# Patient Record
Sex: Female | Born: 1935 | Race: White | Hispanic: No | Smoking: Current every day smoker
Health system: Southern US, Community
[De-identification: ages and names within clinical notes are randomized; demographics above are authoritative.]

## PROBLEM LIST (undated history)

## (undated) DIAGNOSIS — Z9889 Other specified postprocedural states: Secondary | ICD-10-CM

## (undated) DIAGNOSIS — S2239XA Fracture of one rib, unspecified side, initial encounter for closed fracture: Secondary | ICD-10-CM

## (undated) DIAGNOSIS — S2249XA Multiple fractures of ribs, unspecified side, initial encounter for closed fracture: Secondary | ICD-10-CM

## (undated) DIAGNOSIS — I4891 Unspecified atrial fibrillation: Secondary | ICD-10-CM

## (undated) DIAGNOSIS — C801 Malignant (primary) neoplasm, unspecified: Secondary | ICD-10-CM

## (undated) DIAGNOSIS — E119 Type 2 diabetes mellitus without complications: Secondary | ICD-10-CM

## (undated) DIAGNOSIS — C50919 Malignant neoplasm of unspecified site of unspecified female breast: Secondary | ICD-10-CM

## (undated) DIAGNOSIS — K222 Esophageal obstruction: Secondary | ICD-10-CM

## (undated) DIAGNOSIS — E785 Hyperlipidemia, unspecified: Secondary | ICD-10-CM

## (undated) DIAGNOSIS — K219 Gastro-esophageal reflux disease without esophagitis: Secondary | ICD-10-CM

## (undated) DIAGNOSIS — K589 Irritable bowel syndrome without diarrhea: Secondary | ICD-10-CM

## (undated) DIAGNOSIS — H3530 Unspecified macular degeneration: Secondary | ICD-10-CM

## (undated) DIAGNOSIS — S3210XA Unspecified fracture of sacrum, initial encounter for closed fracture: Secondary | ICD-10-CM

## (undated) DIAGNOSIS — E049 Nontoxic goiter, unspecified: Secondary | ICD-10-CM

## (undated) DIAGNOSIS — D0510 Intraductal carcinoma in situ of unspecified breast: Secondary | ICD-10-CM

## (undated) DIAGNOSIS — K449 Diaphragmatic hernia without obstruction or gangrene: Secondary | ICD-10-CM

## (undated) DIAGNOSIS — K859 Acute pancreatitis without necrosis or infection, unspecified: Principal | ICD-10-CM

## (undated) DIAGNOSIS — C50912 Malignant neoplasm of unspecified site of left female breast: Secondary | ICD-10-CM

## (undated) DIAGNOSIS — S329XXA Fracture of unspecified parts of lumbosacral spine and pelvis, initial encounter for closed fracture: Secondary | ICD-10-CM

## (undated) DIAGNOSIS — J449 Chronic obstructive pulmonary disease, unspecified: Secondary | ICD-10-CM

## (undated) HISTORY — DX: Other specified postprocedural states: Z98.890

## (undated) HISTORY — DX: Acute pancreatitis without necrosis or infection, unspecified: K85.90

## (undated) HISTORY — DX: Nontoxic goiter, unspecified: E04.9

## (undated) HISTORY — DX: Irritable bowel syndrome, unspecified: K58.9

## (undated) HISTORY — DX: Unspecified atrial fibrillation: I48.91

## (undated) HISTORY — DX: Unspecified macular degeneration: H35.30

## (undated) HISTORY — DX: Malignant neoplasm of unspecified site of left female breast: C50.912

## (undated) HISTORY — PX: APPENDECTOMY: SHX54

## (undated) HISTORY — PX: OTHER SURGICAL HISTORY: SHX169

## (undated) HISTORY — DX: Malignant neoplasm of unspecified site of unspecified female breast: C50.919

## (undated) HISTORY — PX: TUBAL LIGATION: SHX77

## (undated) HISTORY — DX: Gastro-esophageal reflux disease without esophagitis: K21.9

## (undated) HISTORY — DX: Hyperlipidemia, unspecified: E78.5

## (undated) HISTORY — DX: Type 2 diabetes mellitus without complications: E11.9

## (undated) HISTORY — DX: Chronic obstructive pulmonary disease, unspecified: J44.9

## (undated) HISTORY — DX: Diaphragmatic hernia without obstruction or gangrene: K44.9

## (undated) HISTORY — PX: BREAST LUMPECTOMY: SHX2

## (undated) HISTORY — DX: Esophageal obstruction: K22.2

## (undated) HISTORY — DX: Intraductal carcinoma in situ of unspecified breast: D05.10

## (undated) HISTORY — PX: CHOLECYSTECTOMY: SHX55

---

## 2000-09-13 ENCOUNTER — Encounter: Payer: Self-pay | Admitting: Internal Medicine

## 2000-09-13 ENCOUNTER — Ambulatory Visit (HOSPITAL_COMMUNITY): Admission: RE | Admit: 2000-09-13 | Discharge: 2000-09-13 | Payer: Self-pay | Admitting: Internal Medicine

## 2001-07-02 ENCOUNTER — Ambulatory Visit (HOSPITAL_COMMUNITY): Admission: RE | Admit: 2001-07-02 | Discharge: 2001-07-02 | Payer: Self-pay | Admitting: Family Medicine

## 2001-07-02 ENCOUNTER — Encounter: Payer: Self-pay | Admitting: Family Medicine

## 2001-07-24 ENCOUNTER — Encounter: Payer: Self-pay | Admitting: Family Medicine

## 2001-07-24 ENCOUNTER — Ambulatory Visit (HOSPITAL_COMMUNITY): Admission: RE | Admit: 2001-07-24 | Discharge: 2001-07-24 | Payer: Self-pay | Admitting: Family Medicine

## 2001-07-30 ENCOUNTER — Ambulatory Visit (HOSPITAL_COMMUNITY): Admission: RE | Admit: 2001-07-30 | Discharge: 2001-07-30 | Payer: Self-pay | Admitting: Ophthalmology

## 2001-10-18 DIAGNOSIS — Z9889 Other specified postprocedural states: Secondary | ICD-10-CM

## 2001-10-18 HISTORY — DX: Other specified postprocedural states: Z98.890

## 2001-11-04 ENCOUNTER — Ambulatory Visit (HOSPITAL_COMMUNITY): Admission: RE | Admit: 2001-11-04 | Discharge: 2001-11-04 | Payer: Self-pay | Admitting: Internal Medicine

## 2001-11-04 HISTORY — PX: COLONOSCOPY: SHX174

## 2001-11-04 HISTORY — PX: ESOPHAGOGASTRODUODENOSCOPY: SHX1529

## 2001-12-23 ENCOUNTER — Ambulatory Visit (HOSPITAL_COMMUNITY): Admission: RE | Admit: 2001-12-23 | Discharge: 2001-12-23 | Payer: Self-pay | Admitting: Internal Medicine

## 2001-12-23 ENCOUNTER — Encounter: Payer: Self-pay | Admitting: Internal Medicine

## 2002-01-15 ENCOUNTER — Encounter (INDEPENDENT_AMBULATORY_CARE_PROVIDER_SITE_OTHER): Payer: Self-pay | Admitting: Internal Medicine

## 2002-01-15 ENCOUNTER — Ambulatory Visit (HOSPITAL_COMMUNITY): Admission: RE | Admit: 2002-01-15 | Discharge: 2002-01-15 | Payer: Self-pay | Admitting: Internal Medicine

## 2002-05-14 ENCOUNTER — Other Ambulatory Visit: Admission: RE | Admit: 2002-05-14 | Discharge: 2002-05-14 | Payer: Self-pay | Admitting: Dermatology

## 2002-06-19 ENCOUNTER — Encounter: Payer: Self-pay | Admitting: Family Medicine

## 2002-06-19 ENCOUNTER — Ambulatory Visit (HOSPITAL_COMMUNITY): Admission: RE | Admit: 2002-06-19 | Discharge: 2002-06-19 | Payer: Self-pay | Admitting: Family Medicine

## 2002-08-12 ENCOUNTER — Ambulatory Visit (HOSPITAL_COMMUNITY): Admission: RE | Admit: 2002-08-12 | Discharge: 2002-08-12 | Payer: Self-pay | Admitting: Family Medicine

## 2002-08-12 ENCOUNTER — Encounter: Payer: Self-pay | Admitting: Family Medicine

## 2002-09-08 ENCOUNTER — Encounter: Payer: Self-pay | Admitting: Family Medicine

## 2002-09-08 ENCOUNTER — Ambulatory Visit (HOSPITAL_COMMUNITY): Admission: RE | Admit: 2002-09-08 | Discharge: 2002-09-08 | Payer: Self-pay | Admitting: Family Medicine

## 2002-09-16 ENCOUNTER — Encounter (HOSPITAL_COMMUNITY): Admission: RE | Admit: 2002-09-16 | Discharge: 2002-10-16 | Payer: Self-pay | Admitting: Family Medicine

## 2002-09-17 ENCOUNTER — Encounter: Payer: Self-pay | Admitting: Family Medicine

## 2002-12-24 ENCOUNTER — Ambulatory Visit (HOSPITAL_COMMUNITY): Admission: RE | Admit: 2002-12-24 | Discharge: 2002-12-24 | Payer: Self-pay | Admitting: Family Medicine

## 2002-12-24 ENCOUNTER — Encounter: Payer: Self-pay | Admitting: Family Medicine

## 2003-08-02 ENCOUNTER — Inpatient Hospital Stay (HOSPITAL_COMMUNITY): Admission: EM | Admit: 2003-08-02 | Discharge: 2003-08-03 | Payer: Self-pay | Admitting: Emergency Medicine

## 2003-11-24 ENCOUNTER — Encounter (HOSPITAL_COMMUNITY): Admission: RE | Admit: 2003-11-24 | Discharge: 2003-11-25 | Payer: Self-pay | Admitting: Internal Medicine

## 2004-01-08 ENCOUNTER — Inpatient Hospital Stay (HOSPITAL_COMMUNITY): Admission: EM | Admit: 2004-01-08 | Discharge: 2004-01-11 | Payer: Self-pay | Admitting: Emergency Medicine

## 2004-01-11 HISTORY — PX: ESOPHAGOGASTRODUODENOSCOPY: SHX1529

## 2004-01-11 HISTORY — PX: COLONOSCOPY: SHX174

## 2004-01-18 ENCOUNTER — Ambulatory Visit (HOSPITAL_COMMUNITY): Admission: RE | Admit: 2004-01-18 | Discharge: 2004-01-18 | Payer: Self-pay | Admitting: Internal Medicine

## 2004-02-09 ENCOUNTER — Ambulatory Visit (HOSPITAL_COMMUNITY): Admission: RE | Admit: 2004-02-09 | Discharge: 2004-02-09 | Payer: Self-pay | Admitting: Internal Medicine

## 2004-02-25 ENCOUNTER — Ambulatory Visit: Payer: Self-pay | Admitting: Internal Medicine

## 2004-12-14 ENCOUNTER — Ambulatory Visit (HOSPITAL_COMMUNITY): Admission: RE | Admit: 2004-12-14 | Discharge: 2004-12-14 | Payer: Self-pay | Admitting: Family Medicine

## 2005-01-18 ENCOUNTER — Emergency Department (HOSPITAL_COMMUNITY): Admission: EM | Admit: 2005-01-18 | Discharge: 2005-01-18 | Payer: Self-pay | Admitting: Emergency Medicine

## 2005-02-22 ENCOUNTER — Ambulatory Visit: Payer: Self-pay | Admitting: Internal Medicine

## 2005-05-18 ENCOUNTER — Ambulatory Visit (HOSPITAL_COMMUNITY): Admission: RE | Admit: 2005-05-18 | Discharge: 2005-05-18 | Payer: Self-pay | Admitting: Family Medicine

## 2005-05-30 ENCOUNTER — Ambulatory Visit (HOSPITAL_COMMUNITY): Admission: RE | Admit: 2005-05-30 | Discharge: 2005-05-30 | Payer: Self-pay | Admitting: Family Medicine

## 2005-06-14 ENCOUNTER — Encounter (INDEPENDENT_AMBULATORY_CARE_PROVIDER_SITE_OTHER): Payer: Self-pay | Admitting: Diagnostic Radiology

## 2005-06-14 ENCOUNTER — Encounter (INDEPENDENT_AMBULATORY_CARE_PROVIDER_SITE_OTHER): Payer: Self-pay | Admitting: Specialist

## 2005-06-14 ENCOUNTER — Ambulatory Visit (HOSPITAL_COMMUNITY): Admission: RE | Admit: 2005-06-14 | Discharge: 2005-06-14 | Payer: Self-pay | Admitting: Family Medicine

## 2005-06-21 ENCOUNTER — Ambulatory Visit (HOSPITAL_COMMUNITY): Admission: RE | Admit: 2005-06-21 | Discharge: 2005-06-21 | Payer: Self-pay | Admitting: *Deleted

## 2005-07-26 ENCOUNTER — Encounter (INDEPENDENT_AMBULATORY_CARE_PROVIDER_SITE_OTHER): Payer: Self-pay | Admitting: Specialist

## 2005-07-26 ENCOUNTER — Ambulatory Visit (HOSPITAL_COMMUNITY): Admission: RE | Admit: 2005-07-26 | Discharge: 2005-07-26 | Payer: Self-pay | Admitting: General Surgery

## 2006-07-11 ENCOUNTER — Ambulatory Visit (HOSPITAL_COMMUNITY): Admission: RE | Admit: 2006-07-11 | Discharge: 2006-07-11 | Payer: Self-pay | Admitting: General Surgery

## 2006-12-14 ENCOUNTER — Ambulatory Visit (HOSPITAL_COMMUNITY): Admission: RE | Admit: 2006-12-14 | Discharge: 2006-12-14 | Payer: Self-pay | Admitting: Family Medicine

## 2007-05-17 ENCOUNTER — Emergency Department (HOSPITAL_COMMUNITY): Admission: EM | Admit: 2007-05-17 | Discharge: 2007-05-17 | Payer: Self-pay | Admitting: Emergency Medicine

## 2007-07-15 ENCOUNTER — Ambulatory Visit (HOSPITAL_COMMUNITY): Admission: RE | Admit: 2007-07-15 | Discharge: 2007-07-15 | Payer: Self-pay | Admitting: Family Medicine

## 2007-07-24 ENCOUNTER — Encounter: Admission: RE | Admit: 2007-07-24 | Discharge: 2007-07-24 | Payer: Self-pay | Admitting: Family Medicine

## 2007-07-24 ENCOUNTER — Encounter (INDEPENDENT_AMBULATORY_CARE_PROVIDER_SITE_OTHER): Payer: Self-pay | Admitting: Diagnostic Radiology

## 2007-08-06 ENCOUNTER — Encounter: Admission: RE | Admit: 2007-08-06 | Discharge: 2007-08-06 | Payer: Self-pay | Admitting: Family Medicine

## 2007-12-31 ENCOUNTER — Ambulatory Visit (HOSPITAL_COMMUNITY): Admission: RE | Admit: 2007-12-31 | Discharge: 2007-12-31 | Payer: Self-pay | Admitting: Family Medicine

## 2008-01-27 ENCOUNTER — Ambulatory Visit (HOSPITAL_COMMUNITY): Payer: Self-pay | Admitting: Oncology

## 2008-06-09 ENCOUNTER — Ambulatory Visit (HOSPITAL_COMMUNITY): Admission: RE | Admit: 2008-06-09 | Discharge: 2008-06-09 | Payer: Self-pay | Admitting: Ophthalmology

## 2008-07-27 ENCOUNTER — Ambulatory Visit (HOSPITAL_COMMUNITY): Payer: Self-pay | Admitting: Oncology

## 2008-08-03 ENCOUNTER — Ambulatory Visit (HOSPITAL_COMMUNITY): Admission: RE | Admit: 2008-08-03 | Discharge: 2008-08-03 | Payer: Self-pay | Admitting: Family Medicine

## 2009-04-07 ENCOUNTER — Ambulatory Visit (HOSPITAL_COMMUNITY): Admission: RE | Admit: 2009-04-07 | Discharge: 2009-04-07 | Payer: Self-pay | Admitting: Family Medicine

## 2009-04-21 ENCOUNTER — Ambulatory Visit (HOSPITAL_COMMUNITY): Payer: Self-pay | Admitting: Oncology

## 2009-05-30 ENCOUNTER — Emergency Department (HOSPITAL_COMMUNITY): Admission: EM | Admit: 2009-05-30 | Discharge: 2009-05-30 | Payer: Self-pay | Admitting: Emergency Medicine

## 2009-08-31 ENCOUNTER — Ambulatory Visit (HOSPITAL_COMMUNITY): Admission: RE | Admit: 2009-08-31 | Discharge: 2009-08-31 | Payer: Self-pay | Admitting: Family Medicine

## 2009-10-20 ENCOUNTER — Ambulatory Visit (HOSPITAL_COMMUNITY): Payer: Self-pay | Admitting: Oncology

## 2010-01-04 ENCOUNTER — Ambulatory Visit (HOSPITAL_COMMUNITY): Admission: RE | Admit: 2010-01-04 | Discharge: 2010-01-04 | Payer: Self-pay | Admitting: Urology

## 2010-04-10 ENCOUNTER — Encounter: Payer: Self-pay | Admitting: Family Medicine

## 2010-06-02 ENCOUNTER — Encounter: Payer: Self-pay | Admitting: Gastroenterology

## 2010-06-02 ENCOUNTER — Ambulatory Visit (INDEPENDENT_AMBULATORY_CARE_PROVIDER_SITE_OTHER): Payer: Self-pay | Admitting: Gastroenterology

## 2010-06-02 DIAGNOSIS — K589 Irritable bowel syndrome without diarrhea: Secondary | ICD-10-CM

## 2010-06-02 DIAGNOSIS — K219 Gastro-esophageal reflux disease without esophagitis: Secondary | ICD-10-CM

## 2010-06-06 ENCOUNTER — Encounter: Payer: Self-pay | Admitting: Gastroenterology

## 2010-06-07 NOTE — Assessment & Plan Note (Signed)
Summary: abdominal pain-   Vital Signs:  Patient profile:   77 year old female Height:      62 inches Weight:      135 pounds BMI:     24.78 Temp:     97.8 degrees F oral Pulse rate:   68 / minute BP sitting:   136 / 76  (left arm)  Vitals Entered By: Carolan Clines LPN (June 02, 2010 8:42 AM)  Visit Type:  Follow-up Visit   History of Present Illness: [name] is a pleasant 77 year old Caucasian female who actually appears younger than stated age. She has hx of chronic lower abdominal pain, IBS-d, wt loss, and GERD. Last seen in Dec 2006 with a wt of 135. Wt remains stable today. Last EGD/colon in 2005 (incomplete colonoscopy) with reflux esophagitis, Schatzki's ring s/p dilation, normal rectum and densely populated sigmoid diverticula. Presents today with lower abdominal crampy discomfort, right before BM, relieved afterwards. Will alternate between 5-6 soft stools/day and then have periods of 1-2 stools per day. Usually postprandial, no melena or brbpr. Denies any nausea. Has been on dicyclomine in past, yet has had to taper off to just once/day due to finances. Feels like it didn't really help. States immodium helps significantly with frequency of stools and abdominal cramping. Not using supplemental fiber. Does have occasional reflux; has taken nexium in past but became too expensive. Did well with Prilosec. No dysphagia or odynophagia.   Current Medications (verified): 1)  Eye-Vite Extra Plus Lutein  Tabs (Multiple Vitamins-Minerals) .... Take One Bid 2)  Glipizide 2.5 Mg Xr24h-Tab (Glipizide) .... Take One Once Daily 3)  Simvastatin 5 Mg Tabs (Simvastatin) .... Take One Once Daily At Bed Time 4)  Dicyclomine Hcl 10 Mg Caps (Dicyclomine Hcl) .... Take Two in The Morning and One in The Evening 5)  Alprazolam 0.5 Mg Tabs (Alprazolam) .... Take One Qid As Needed 6)  Gemfibrozil 600 Mg Tabs (Gemfibrozil) .... Take One Two Times A Day 7)  Warfarin Sodium 5 Mg Tabs (Warfarin Sodium)  .... Take As Directed By Physician 8)  Lisinopril 40 Mg Tabs (Lisinopril) .... Take One Bid 9)  Tamoxifen Citrate 10 Mg Tabs (Tamoxifen Citrate) .... Take One Two Times A Day 10)  Amlodipine Besylate 5 Mg Tabs (Amlodipine Besylate) .... Take One Once Daily 11)  Fish Oil 1000 Mg Caps (Omega-3 Fatty Acids) .... Take One Once Daily  Allergies (verified): 1)  ! Pcn  Past History:  Past Medical History: right breast ca: s/p lumpectomy 5-6 years ago left breast ca, diagnosed 2 years ago, no intervention thyroid goiter GERD DM hyperlipidemia emphysema  Past Surgical History: Right breast lumpectomy (ca) cholecystectomy appendectomy hysterectomy tubal ligation  Family History: No FH of Colon Cancer:  Social History: +smoker, 1ppd Alcohol Use - no  Review of Systems General:  Denies fever, chills, and anorexia. Eyes:  Denies blurring, irritation, and discharge. ENT:  Denies sore throat, hoarseness, and difficulty swallowing. CV:  Denies chest pains and syncope. Resp:  Denies dyspnea at rest and wheezing. GI:  See HPI. GU:  Denies urinary burning and urinary frequency. MS:  Denies joint pain / LOM, joint swelling, and joint stiffness. Derm:  Denies rash, itching, and dry skin. Neuro:  Denies weakness and syncope. Psych:  Denies depression and anxiety.  Physical Exam  General:  Well developed, well nourished, no acute distress. Head:  Normocephalic and atraumatic. Eyes:  sclera without icterus Lungs:  expiratory wheeze, diminished bases. Heart:  S1 S2 present Abdomen:  +  BS, soft, non-tender, non-distended. no HSM, no rebound or guarding.  Msk:  Symmetrical with no gross deformities. Normal posture. Extremities:  No clubbing, cyanosis, edema or deformities noted. Neurologic:  Alert and  oriented x4;  grossly normal neurologically. Skin:  Intact without significant lesions or rashes. Psych:  Alert and cooperative. Normal mood and affect.   Impression &  Recommendations:  Problem # 1:  IRRITABLE BOWEL SYNDROME (ICD-34.1)  77 year old female, appears younger than stated age, who presents with hx of chronic diarrhea and lower abdominal pain. Weight is stable. Reports lower abdominal cramping preceeding BM, relieved after defecation. Has noticed significant improvement with immodium. No melena or brbpr. Symptoms seem to be r/t types of foods she eats. This is consistent with her past hx of IBS, diarrhea predominant. Although she has taken dicyclomine in past, she doesn't think this really helps as much as immodium. We will refill dicyclomine and use as needed; immodium to be used as needed. include high fiber diet  Dicyclomine as needed Immodium as needed, watch for constipation Fiber supplement daily, samples given F/U in 3 mos  Orders: Est. Patient Level II (16109)  Problem # 2:  GERD (ICD-530.81)   occasional reflux. no dysphagia or odynophagia. Will add Prilosec OTC. Samples given. Return in 3 mos for reassessment.   Orders: Est. Patient Level II (60454)  Patient Instructions: 1)  Take dicyclomine one before each meal as needed 2)  Immodium as needed, avoid if constipated 3)  High fiber diet, fiber supplement daily (samples given) 4)  Prilosec 20 mg daily (may get over the counter) 5)  Return in 3 months. 6)  The medication list was reviewed and reconciled.  All changed / newly prescribed medications were explained.  A complete medication list was provided to the patient / caregiver.  Prescriptions: BENTYL 10 MG CAPS (DICYCLOMINE HCL) take 1 by mouth before each meal as needed  #120 x 3   Entered and Authorized by:   Gerrit Halls NP   Signed by:   Gerrit Halls NP on 06/02/2010   Method used:   Faxed to ...       [facility]* (retail)       [address]       [place], [place]  [address]       Ph: [phone]       Fax: [phone]   RxID:   [id number]    Orders Added: 1)  Est. Patient  Level II [27253]  Appended Document: abdominal pain- 3 MONTH F/U OPV IS IN THE COMPUTER

## 2010-06-16 NOTE — Medication Information (Signed)
Summary: PA for dicyclomine  PA for dicyclomine   Imported By: Hendricks Limes LPN 04/54/0981 19:14:78  _____________________________________________________________________  External Attachment:    Type:   Image     Comment:   External Document

## 2010-06-30 LAB — BASIC METABOLIC PANEL
CO2: 24 mEq/L (ref 19–32)
Chloride: 101 mEq/L (ref 96–112)
Creatinine, Ser: 0.92 mg/dL (ref 0.4–1.2)
GFR calc Af Amer: >60 mL/min (ref >60)
Potassium: 4.3 mEq/L (ref 3.5–5.1)
Sodium: 135 mEq/L (ref 135–145)

## 2010-06-30 LAB — GLUCOSE, CAPILLARY: Glucose-Capillary: 94 mg/dL (ref 70–99)

## 2010-06-30 LAB — HEMOGLOBIN AND HEMATOCRIT, BLOOD: HCT: 42.1 % (ref 36.0–46.0)

## 2010-07-25 LAB — COMPREHENSIVE METABOLIC PANEL
Albumin: 3.3
Alkaline Phosphatase: 48 U/L
BUN: 24 mg/dL — AB (ref 4–21)
Creat: 0.79
Glucose: 86 mg/dL
Potassium: 5 mmol/L
Total Bilirubin: 0.2 mg/dL

## 2010-07-29 ENCOUNTER — Emergency Department (HOSPITAL_COMMUNITY)
Admission: EM | Admit: 2010-07-29 | Discharge: 2010-07-29 | Disposition: A | Discharge status: Home / Self Care | Payer: Medicare Other | Attending: Emergency Medicine | Admitting: Emergency Medicine

## 2010-07-29 ENCOUNTER — Emergency Department (HOSPITAL_COMMUNITY): Payer: Medicare Other

## 2010-07-29 DIAGNOSIS — J189 Pneumonia, unspecified organism: Secondary | ICD-10-CM | POA: Insufficient documentation

## 2010-07-29 DIAGNOSIS — R300 Dysuria: Secondary | ICD-10-CM | POA: Insufficient documentation

## 2010-07-29 DIAGNOSIS — R0602 Shortness of breath: Secondary | ICD-10-CM | POA: Insufficient documentation

## 2010-07-29 DIAGNOSIS — R05 Cough: Secondary | ICD-10-CM | POA: Insufficient documentation

## 2010-07-29 DIAGNOSIS — R059 Cough, unspecified: Secondary | ICD-10-CM | POA: Insufficient documentation

## 2010-07-29 LAB — CBC
Hemoglobin: 10.8 g/dL — ABNORMAL LOW (ref 12.0–15.0)
MCH: 28.1 pg (ref 26.0–34.0)
MCHC: 31.3 g/dL (ref 30.0–36.0)
MCV: 89.8 fL (ref 78.0–100.0)
Platelets: 453 10*3/uL — ABNORMAL HIGH (ref 150–400)

## 2010-07-29 LAB — URINE MICROSCOPIC-ADD ON

## 2010-07-29 LAB — DIFFERENTIAL
Basophils Relative: 1 % (ref 0–1)
Eosinophils Absolute: 1.3 10*3/uL — ABNORMAL HIGH (ref 0.0–0.7)
Lymphs Abs: 1.6 10*3/uL (ref 0.7–4.0)
Monocytes Absolute: 1 10*3/uL (ref 0.1–1.0)
Monocytes Relative: 8 % (ref 3–12)

## 2010-07-29 LAB — BASIC METABOLIC PANEL
CO2: 29 mEq/L (ref 19–32)
Chloride: 102 mEq/L (ref 96–112)
Creatinine, Ser: 0.74 mg/dL (ref 0.4–1.2)
GFR calc Af Amer: >60 mL/min (ref >60)
Potassium: 4.5 mEq/L (ref 3.5–5.1)

## 2010-07-29 LAB — URINALYSIS, ROUTINE W REFLEX MICROSCOPIC
Leukocytes, UA: NEGATIVE
Nitrite: POSITIVE — AB
Protein, ur: NEGATIVE mg/dL
Urobilinogen, UA: 0.2 mg/dL (ref 0.0–1.0)

## 2010-07-29 LAB — PROTIME-INR: Prothrombin Time: 44.4 seconds — ABNORMAL HIGH (ref 11.6–15.2)

## 2010-07-31 LAB — URINE CULTURE
Colony Count: >=100000
Culture  Setup Time: 201205111938

## 2010-08-05 NOTE — Discharge Summary (Signed)
NAMEALEXCIS, BICKING               ACCOUNT NO.:  [id number]   MEDICAL RECORD NO.:  [id number]          PATIENT TYPE:  INP   LOCATION:  A311                          FACILITY:  [facility]   PHYSICIAN:  Madelin Rear. Sherwood Gambler, MD  DATE OF BIRTH:  [birth year]   DATE OF ADMISSION:  01/08/2004  DATE OF DISCHARGE:  10/24/2005LH                                 DISCHARGE SUMMARY   DISCHARGE DIAGNOSES:  1.  Diverticulitis, acute.  2.  Asthma.  3.  Gastroesophageal reflux disease.  4.  Diaphragmatic hernia/ hiatal hernia.  5.  Diabetes mellitus type 2.  6.  Hyperlipidemia.   PROCEDURES:  Esophageal dilatation, small bowel endoscopy, and colonoscopy.   CONSULTATIONS:  Gastroenterology.   DISCHARGE MEDICATIONS:  Protonix 40 mg p.o. b.i.d.   SUMMARY:  The patient was admitted with abdominal pain, nausea and vomiting.  She was treated in the office recently for a respiratory infection with  sinusitis, and developed diarrhea.  She noted possible melena for three days  prior to visiting the emergency department where she complained of crampy  abdominal pain.  She was noted to have diverticulosis and noninsulin  dependent diabetes in the past as well as diverticulitis.  CT showed some  thickening of the rectosigmoid with possible diverticulitis without abscess.  Due to the progression of her symptoms, she was admitted for parenteral  antibiotics.   CONSULTATIONS:  As outlined above.   HOSPITAL COURSE:  She improved on IV antibiotics and gradually reintroduced  a normal diet, and followup in office.     Lawr   LJF/MEDQ  D:  02/11/2004  T:  02/11/2004  Job:  [id number]

## 2010-08-05 NOTE — H&P (Signed)
NAMEMELESSIA, KAUS               ACCOUNT NO.:  [id number]   MEDICAL RECORD NO.:  [id number]          PATIENT TYPE:  INP   LOCATION:  A311                          FACILITY:  [facility]   PHYSICIAN:  Madelin Rear. Sherwood Gambler, MD  DATE OF BIRTH:  1920/11/28   DATE OF ADMISSION:  01/08/2004  DATE OF DISCHARGE:  LH                                HISTORY & PHYSICAL   CHIEF COMPLAINT:  Abdominal pain, nausea and vomiting.   The patient was recently treated in the office for an upper respiratory  infection about one week ago with sinusitis symptoms and developed diarrhea  that has been to her appearance melanotic without gross bleeding for three  days prior to emergency room visitation.  She has complained of 2/10  abdominal pain, crampy in the periumbilical and lower suprapubic areas.  She  has had vomiting as well but denies any hematemesis, hematochezia, or  melena.   PAST MEDICAL HISTORY:  1.  Diverticulosis.  Recently scheduled to have a fine needle aspirate of a      biopsy goiter but did not make that appointment secondary to the      diarrhea.  2.  Non-insulin-dependent diabetes mellitus.  3.  Diverticulitis.  4.  Hyperlipidemia.   SOCIAL HISTORY:  Positive for cigarette smoking.  Negative for alcohol or  other drug use.   FAMILY HISTORY:  Noncontributory for this illness.   REVIEW OF SYSTEMS:  As under HPI, all else is negative.   PHYSICAL EXAMINATION:  SKIN:  Unremarkable.  HEAD/NECK:  No JVD or adenopathy.  Neck supple.  CHEST:  Scattered rhonchi and wheezing.  CARDIAC:  Regular rhythm without gallop or rub.  ABDOMEN:  Soft.  Positive suprapubic and bilateral lower quadrant tenderness  - left greater than right.  No guarding or rebound.  No organomegaly or  masses.  EXTREMITIES:  No clubbing, cyanosis, edema.  NEURO:  Nonfocal.   LABORATORY STUDIES:  CBC reveals an elevated white count 11.5 with a normal  H&H and platelet count.  Electrolytes are pending as is her amylase  and  lipase.   A CT scan abdomen and pelvis was obtained and was reported to me as showing  colitis, thickening in the rectosigmoid, as well as possible diverticulitis  without abscess.  This will be reviewed when available from radiology.  I  was also told there was some biliary duct dilatation, according to the ER  physician on the CT scan.   IMPRESSION:  1.  Abdominal pain, diverticulitis.  Bring in for intravenous parenteral      antibiotics.  2.  Asthmatic bronchitis.  Bronchodilators as needed.  Antibiotics as above.  3.  Non-insulin-dependent diabetes mellitus.  Serial blood sugars, coverage      as needed.  4.  Question biliary duct dilatation.  We will get a gallbladder and upper      abdominal ultrasound as well as a hepatobiliary scan.  5.  Possible goiter.  We will arrange for a set of thyroid functions to be      obtained and consult endocrine regarding the fine needle aspirate  while      in house, when her abdominal situation is more stabilized.     Lawr   LJF/MEDQ  D:  01/08/2004  T:  01/08/2004  Job:  [id number]

## 2010-08-05 NOTE — Consult Note (Signed)
Sara Cohen, Sara Cohen               ACCOUNT NO.:  [id number]   MEDICAL RECORD NO.:  [id number]          PATIENT TYPE:  INP   LOCATION:  A311                          FACILITY:  [facility]   PHYSICIAN:  R. Roetta Sessions, M.D. DATE OF BIRTH:  08-07-1920   DATE OF CONSULTATION:  01/09/2004  DATE OF DISCHARGE:                                   CONSULTATION   REFERRING PHYSICIAN:  Madelin Rear. Sherwood Gambler, M.D.   REASON FOR CONSULTATION:  1.  Nausea, vomiting and nonbloody diarrhea.  2.  Dilated biliary tree.  3.  Abnormal colon and esophagus on CT.   HISTORY OF PRESENT ILLNESS:  Sara Cohen is a pleasant, 77 year old,  Caucasian female admitted to the hospital yesterday with a three-day history  of nonbloody diarrhea, abdominal cramps and intermitted nausea and vomiting.   She complained of some dark stools, but was taking quite a bit of Pepto-  Bismol to stop the diarrhea.  She was Hemoccult negative on admission.  Since being admitted yesterday, the above-mentioned GI symptoms have  improved significantly.   She has been started on Levaquin and Cleocin.  Stool for Clostridium  difficile toxin assay has been sent.  Admission laboratories include a set  of LFTs which were completely normal.  White count 11.5, hemoglobin 14.1,  hematocrit 41.1 and platelet count 243.  Amylase and lipase normal.   Sara Cohen relates daily heartburn symptoms to me and intermittent  esophageal dysphagia.  She takes anything she can get her hands on to  treat reflux.  She has never had an upper endoscopy per her report.  She had  a colonoscopy a number of years ago by Dr. Karilyn Cota (records not available).  She reportedly had polyps removed.  There is no family history of colorectal  neoplasia or inflammatory bowel disease.  This lady has been taking  antibiotics for a sinus infection recently.  She has also been having  significant headaches for which she underwent a CT scan of the head today  which revealed no  acute findings.   CT scan of the abdomen yesterday demonstrated a markedly dilated biliary  tree.  The intrahepatics were dilated to almost 18 mm.  The gallbladder is  gone.  The dilation extends all the way down through the head of the  pancreas.  The pancreas appears normal.  There is no mass.  There is also  significant thickening of the rectosigmoid colon with diverticulosis without  any associated inflammatory changes.   The distal esophagus in a setting of a large hiatal hernia is markedly  thickened without discrete mass appreciated.  I personally reviewed the  films and discussed the case with Dr. Allyson Sabal today.   PAST MEDICAL HISTORY:  1.  Gangrenous cholecystitis by report.  She underwent what she describes as      a partial cholecystectomy because her gallbladder was rotten some 60      years ago in [place], [place].  She spent three weeks in the hospital.      She subsequently had problems and Dr. Jackquline Bosch removed the rest of her  gallbladder here at [facility] many years ago.  She denies      ever being jaundiced.  2.  History of diverticulosis.  3.  History of a thyroid goiter.  She is to have a fine needle aspirate, but      this has been held off because of her recent acute illness.  4.  History of GERD without apparent prior workup.  5.  History of type 2 diabetes mellitus.  6.  History of hyperlipidemia.   PAST SURGICAL HISTORY:  Her surgeries include:  1.  Apparently two biliary surgeries for gallbladder.  2.  Appendectomy.  3.  Hysterectomy.  4.  Tubal ligation.  5.  History of asthmatic bronchitis.   MEDICATIONS:  Medications as an outpatient are Lipitor, digoxin, Xanax and  Prinivil.  She is now on Levaquin and Cleocin, as well as Glucotrol and  __________.   FAMILY HISTORY:  Negative chronic GI or liver disease.   SOCIAL HISTORY:  The patient is widowed.  She lives near [place], [place].  She smokes a packs of cigarettes a day and has  done so for years.  She has four children.  She does not use alcohol.   REVIEW OF SYSTEMS:  __________ daily reflux.  Does not have chronic  abdominal pain.  Generally moves her bowels one to three times daily, except  in the setting of this recent acute illness.  No melena.  No rectal  bleeding.  She says she may have lost 30 pounds over the last three to four  years insidiously.  No acute weight loss recently.   PHYSICAL EXAMINATION:  GENERAL APPEARANCE:  An alert, conversant, well-  oriented, pleasant female in no acute distress.  VITAL SIGNS:  Temperature 98.4 degrees, BP 126/83, pulse 80.  SKIN:  Warm and dry.  No jaundice.  No stigmata of chronic liver disease.  HEENT:  No scleral icterus.  The conjunctivae are pink.  Oral cavity:  She  has upper dentures.  No teeth below.  No oral lesions.  NECK:  JVD is not prominent.  CHEST:  The lungs are clear to auscultation.  CARDIAC:  Regular rate and rhythm without murmur, rub or gallop.  BREASTS:  Exam deferred.  ABDOMEN:  She has multiple surgical scars present.  The abdomen is  nondistended and soft with positive bowel sounds.  The abdomen is entirely  nontender to palpation without appreciable mass or organomegaly.  EXTREMITIES:  No edema.   ADDITIONAL LABORATORY DATA:  Today, white count 10.1, hemoglobin 12.6 and  hematocrit 37.5.  Sodium 138, potassium 4.3, chloride 106, CO2 25, glucose  99, BUN 26, creatinine 1.0.  Amylase 46, lipase 29.  Digoxin level 1.1.  Hemoccult stool negative.   ADMITTING IMPRESSION:  Sara Cohen is a pleasant 77 year old lady  admitted to the hospital with an acute illness characterized by abdominal  cramps, nonbloody diarrhea, nausea and vomiting.  She apparently has  improved significantly over the past 24 hours.  She gave a report of black  tarry stools, but was taken quite a bit of Metamucil.  She is Hemoccult  negative.  She does have significant thickening of her rectum and sigmoid colon  which  needs further investigation, but well may be an indicator of recent  foodborne illness or infectious process.  She has diverticulosis without  inflammatory changes going with diverticulitis on CT as well.  In addition,  she has a markedly dilated biliary tree.  Status post distant  cholecystectomy.  No obvious neoplastic process.  It is notable that this  finding is fairly stable going back over one year to June of 2004.  Her LFTs  are well within normal limits.  I suspect this is going to end up being a  normal finding status post cholecystectomy, but does deserve further  investigation.   She has daily gastrointestinal reflux symptoms with dysphagia and an  abnormal-appearing distal esophagus on CT.  This also warrants further  investigation.   RECOMMENDATIONS:  1.  Agree with right upper quadrant ultrasound.  This will give [place] a little      more information in regards to the biliary tree status post      cholecystectomy.  However, would hold off on hepatobiliary scan as it      will not likely give [place] any additional information.  2.  She needs colonoscopy to further evaluate the abnormal rectum and colon      on recent CT.  I have discussed this approach with Sara Cohen at      length.  She is agreeable.  Will follow up on pending stool studies as      well.  3.  I have also offered this nice lady and EGD to further evaluate her      symptoms of GERD and dysphagia and the abnormal-appearing esophagus on      CT scan.  At this time, will go ahead and utilize a side-viewing scope      and get a good look at the ampulla just to make sure there is not a      small tumor present.   Assuming her ampulla appears normal, would also plan to get an MRCP to wrap  up the workup of her dilated biliary tree.   Further recommendations to follow.   I would like to thank Madelin Rear. Sherwood Gambler, M.D., for allowing me to see this  nice lady today.     Otelia Sergeant   RMR/MEDQ  D:  01/09/2004  T:   01/09/2004  Job:  [id number]

## 2010-08-05 NOTE — Op Note (Signed)
NAMECHELBIE, JARNAGIN               ACCOUNT NO.:  [id number]   MEDICAL RECORD NO.:  [id number]          PATIENT TYPE:  AMB   LOCATION:  SDS                          FACILITY:  [facility]   PHYSICIAN:  Rose Phi. Maple Hudson, M.D.   DATE OF BIRTH:  [birth year]   DATE OF PROCEDURE:  07/26/2005  DATE OF DISCHARGE:  07/26/2005                                 OPERATIVE REPORT   PREOPERATIVE DIAGNOSIS:  Carcinoma of the right breast.   POSTOPERATIVE DIAGNOSIS:  Carcinoma of the right breast.   OPERATION:  Right partial mastectomy.   SURGEON:  Rose Phi. Maple Hudson, M.D.   ANESTHESIA:  MAC.   OPERATIVE PROCEDURE:  This 77 year old female had presented with a palpable  nodule and an abnormal mammogram at about the 6 o'clock position of her  right breast.  Core biopsy had shown an invasive mammary cancer that was  ER/PR-positive.  She was scheduled for a right partial mastectomy.   The patient was placed on the operating table with the arms extended on the  arm board and the right breast prepped and draped in the usual fashion.  The  lesion was palpable basically at the 6 o'clock position, so a vertical  ellipse incision was outlined, incorporating some skin.  It was thoroughly  infiltrated with the local anesthetic mixture.  Incision was made and a wide  excision was carried out.  Hemostasis was obtained with the cautery.  Specimen was oriented for the pathologist.   With a little mobilization of the lower pole of the breast, it was easily  closed in a vertical direction with interrupted 3-0 Vicryl and subcuticular  4-0 Monocryl and Steri-Strips.  Dressing was applied.  The patient was then  transferred to the recovery room in satisfactory condition, having tolerated  procedure well.      Rose Phi. Maple Hudson, M.D.  Electronically Signed     PRY/MEDQ  D:  07/26/2005  T:  07/27/2005  Job:  [id number]

## 2010-09-02 ENCOUNTER — Ambulatory Visit: Payer: Medicare Other | Admitting: Internal Medicine

## 2010-10-19 ENCOUNTER — Ambulatory Visit (HOSPITAL_COMMUNITY): Payer: Self-pay

## 2010-11-15 ENCOUNTER — Other Ambulatory Visit (HOSPITAL_COMMUNITY): Payer: Self-pay | Admitting: Family Medicine

## 2010-11-15 DIAGNOSIS — D0590 Unspecified type of carcinoma in situ of unspecified breast: Secondary | ICD-10-CM

## 2010-11-15 DIAGNOSIS — R634 Abnormal weight loss: Secondary | ICD-10-CM

## 2010-11-16 ENCOUNTER — Ambulatory Visit (HOSPITAL_COMMUNITY): Payer: Medicare Other

## 2010-11-16 ENCOUNTER — Other Ambulatory Visit (HOSPITAL_COMMUNITY): Payer: Medicare Other

## 2010-11-22 ENCOUNTER — Other Ambulatory Visit (HOSPITAL_COMMUNITY): Payer: Self-pay | Admitting: Family Medicine

## 2010-11-22 ENCOUNTER — Encounter (HOSPITAL_COMMUNITY): Payer: Self-pay

## 2010-11-22 ENCOUNTER — Ambulatory Visit (HOSPITAL_COMMUNITY)
Admission: RE | Admit: 2010-11-22 | Discharge: 2010-11-22 | Disposition: A | Discharge status: Home / Self Care | Payer: Medicare Other | Source: Ambulatory Visit | Attending: Family Medicine | Admitting: Family Medicine

## 2010-11-22 DIAGNOSIS — R109 Unspecified abdominal pain: Secondary | ICD-10-CM | POA: Insufficient documentation

## 2010-11-22 DIAGNOSIS — R634 Abnormal weight loss: Secondary | ICD-10-CM

## 2010-11-22 DIAGNOSIS — C50919 Malignant neoplasm of unspecified site of unspecified female breast: Secondary | ICD-10-CM | POA: Insufficient documentation

## 2010-11-22 DIAGNOSIS — D0590 Unspecified type of carcinoma in situ of unspecified breast: Secondary | ICD-10-CM

## 2010-11-22 DIAGNOSIS — E049 Nontoxic goiter, unspecified: Secondary | ICD-10-CM | POA: Insufficient documentation

## 2010-11-22 HISTORY — DX: Malignant (primary) neoplasm, unspecified: C80.1

## 2010-11-22 MED ORDER — IOHEXOL 300 MG/ML  SOLN
100.0000 mL | Freq: Once | INTRAMUSCULAR | Status: AC | PRN
  Administered 2010-11-22: 100 mL via INTRAVENOUS

## 2010-11-24 ENCOUNTER — Encounter (HOSPITAL_COMMUNITY): Payer: Medicare Other | Attending: Oncology | Admitting: Oncology

## 2010-11-24 ENCOUNTER — Encounter (HOSPITAL_COMMUNITY): Payer: Self-pay | Admitting: Oncology

## 2010-11-24 ENCOUNTER — Other Ambulatory Visit (HOSPITAL_COMMUNITY): Payer: Self-pay | Admitting: Oncology

## 2010-11-24 VITALS — BP 110/65 | HR 79 | Temp 98.3°F | Wt 129.6 lb

## 2010-11-24 DIAGNOSIS — D0510 Intraductal carcinoma in situ of unspecified breast: Secondary | ICD-10-CM

## 2010-11-24 DIAGNOSIS — R928 Other abnormal and inconclusive findings on diagnostic imaging of breast: Secondary | ICD-10-CM

## 2010-11-24 DIAGNOSIS — C50919 Malignant neoplasm of unspecified site of unspecified female breast: Secondary | ICD-10-CM

## 2010-11-24 DIAGNOSIS — J449 Chronic obstructive pulmonary disease, unspecified: Secondary | ICD-10-CM

## 2010-11-24 DIAGNOSIS — N63 Unspecified lump in unspecified breast: Secondary | ICD-10-CM

## 2010-11-24 DIAGNOSIS — D0590 Unspecified type of carcinoma in situ of unspecified breast: Secondary | ICD-10-CM

## 2010-11-24 HISTORY — DX: Intraductal carcinoma in situ of unspecified breast: D05.10

## 2010-11-24 HISTORY — DX: Malignant neoplasm of unspecified site of unspecified female breast: C50.919

## 2010-11-24 HISTORY — DX: Chronic obstructive pulmonary disease, unspecified: J44.9

## 2010-11-24 NOTE — Progress Notes (Signed)
No primary provider on file. No primary provider on file.  1. Breast nodule  US Breast Right  2. Invasive lobular carcinoma of breast, stage 1    3. DCIS (ductal carcinoma in situ) of breast    4. COPD (chronic obstructive pulmonary disease)      CURRENT THERAPY:S/P partial mastecomy in May 2007.  S/P 5 years of Tamoxifen therapy.  INTERVAL HISTORY: Sara Cohen 77 y.o. female returns for  regular  visit for followup of Stage I Breast cancer.  She denies any complaints today.  She reports that she had nice Labor day weekend.    The patient tells me that she recently had a CT scan.  I personally reviewed and went over radiographic studies with the patient.  These scans shows no evidence of disease in the chest, abdomen, or pelvis.  Past Medical History  Diagnosis Date  . S/P lumpectomy of breast 5-6 yrs ago    right breast ca  . Breast cancer, left breast diagnosed 2 yrs ago    no intervention  . Thyroid goiter   . GERD (gastroesophageal reflux disease)   . DM (diabetes mellitus)   . Hyperlipidemia   . Emphysema   . Cancer     lt breast ca/dx 2010/lumpectomy  . Macular degeneration, left eye   . Invasive lobular carcinoma of breast, stage 1 11/24/2010  . 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 COPD (chronic obstructive pulmonary disease) 11/24/2010    has GERD; IRRITABLE BOWEL SYNDROME; Invasive lobular carcinoma of breast, stage 1; DCIS (ductal carcinoma in situ) of breast; and COPD (chronic obstructive pulmonary disease) on her problem list.     is allergic to penicillins.  Sara Cohen does not currently have medications on file.  Past Surgical History  Procedure Date  . Breast lumpectomy     right breast  . Cholecystectomy   . Appendectomy   . Tubal ligation   . S/p hysterectomy     Denies any headaches, dizziness, double vision, fevers, chills, night sweats, nausea, vomiting, diarrhea, constipation, chest pain, heart palpitations, shortness of  breath, blood in stool, black tarry stool, urinary pain, urinary burning, urinary frequency, hematuria.   PHYSICAL EXAMINATION  ECOG PERFORMANCE STATUS: 0 - Asymptomatic  Filed Vitals:   11/24/10 1138  BP: 110/65  Pulse: 79  Temp: 98.3 F (36.8 C)    GENERAL:alert, no distress, well nourished, well developed, comfortable, cooperative and smiling SKIN: skin color, texture, turgor are normal, no rashes or significant lesions HEAD: Normocephalic EYES: normal EARS: External ears normal OROPHARYNX:mucous membranes are moist  NECK: trachea midline LYMPH:  no palpable lymphadenopathy, no hepatosplenomegaly BREAST:left breast normal without mass, skin or nipple changes or axillary nodes, abnormal mass palpable in the right breast in the 3 o'clock position measuring 0.75 x 0.75 cm.  It is soft and mobile.  It is not tender.  No nipple distortion or discharge.  No erythema or breast tissue distortion. LUNGS: clear to auscultation and percussion with diffuse decrease breath sounds HEART: regular rate & rhythm, no murmurs, no gallops, S1 normal and S2 normal ABDOMEN:abdomen soft, non-tender, normal bowel sounds and no hepatosplenomegaly BACK: Back symmetric, no curvature., No CVA tenderness EXTREMITIES:less then 2 second capillary refill, no joint deformities, effusion, or inflammation, no edema, no skin discoloration, no clubbing, no cyanosis  NEURO: alert & oriented x 3 with fluent speech, no focal motor/sensory deficits, gait normal   RADIOGRAPHIC STUDIES:  11/22/10  *RADIOLOGY REPORT*  Clinical Data: Breast  cancer, abdominal pain, weight loss, status  post hysterectomy, cholecystectomy, and appendectomy  CT CHEST, ABDOMEN AND PELVIS WITH CONTRAST  Technique: Multidetector CT imaging of the chest, abdomen and  pelvis was performed following the standard protocol during bolus  administration of intravenous contrast.  Contrast: 100 ml Omnipaque-300 IV  Comparison: CT abdomen pelvis  dated 01/04/2010  CT CHEST  Findings: Visualized thyroid is notable for bilateral thyroid  nodules with calcifications.  The heart is normal in size. No pericardial effusion.  Atherosclerotic calcifications of the aortic arch.  Small mediastinal lymph nodes which do not meet pathologic CT size  criteria. No suspicious hilar or axillary lymphadenopathy.  No suspicious pulmonary nodules. No pleural effusion or  pneumothorax.  Degenerative changes of the visualized thoracolumbar spine. No  focal osseous lesions.  IMPRESSION:  No evidence of metastatic disease in the chest.  Bilateral thyroid nodules with calcifications.  CT ABDOMEN AND PELVIS  Findings: Tiny hiatal hernia.  Focal fat along the falciform ligament. Calcified granuloma  posteriorly. No suspicious hepatic lesions.  Spleen, pancreas, and adrenal glands are within normal limits.  Status post cholecystectomy. Mild central intrahepatic ductal  dilatation. Common bile duct measures 10 mm, stable.  Bilateral renal cysts. No hydronephrosis.  No evidence of bowel obstruction. Colonic diverticulosis, without  associated inflammatory changes.  Atherosclerotic calcifications of the abdominal aorta and branch  vessels.  No abdominopelvic ascites.  No suspicious abdominopelvic lymphadenopathy.  Status post hysterectomy. No adnexal masses.  Bladder is unremarkable.  Degenerative changes of the visualized thoracolumbar spine, most  severe at L2-3. No focal osseous lesions.  IMPRESSION:  No evidence of metastatic disease in the abdomen/pelvis.  Status post cholecystectomy. Stable mild intrahepatic and  extrahepatic ductal dilatation.  Additional stable ancillary findings as above.  Original Report Authenticated By: Charline Bills, M.D.    ASSESSMENT:  1. Stage I invasive tubulolobular breast cancer on the right 2. DCIS of the left breat in May 2009.  3. COPD, still smoking 4. Fibroglandular tissue of the right  breast.   PLAN:  1. US of the right breast was ordered and then cancelled for evaluation of breast nodule.  This was cancelled because protocol requires a mammogram within one year before an [place] can be performed on the breast.  The patient adamantly refused a mammogram.  After speaking to radiology department, I did inform the patient of this information and she continued to decline a mammogram.  Radiology continued to refuse to perform the study. 2. Return to the clinic in three months for follow-up to evaluate the mass.   All questions were answered. The patient knows to call the clinic with any problems, questions or concerns. We can certainly see the patient much sooner if necessary.    Westlyn Glaza

## 2010-11-24 NOTE — Patient Instructions (Signed)
[facility]  Discharge Instructions  RECOMMENDATIONS MADE BY THE CONSULTANT AND ANY TEST RESULTS WILL BE SENT TO YOUR REFERRING DOCTOR.   EXAM FINDINGS BY MD TODAY AND SIGNS AND SYMPTOMS TO REPORT TO CLINIC OR PRIMARY MD: need to check out nodule in right breast.  Will do ultrasound of the breast  MEDICATIONS PRESCRIBED: none     SPECIAL INSTRUCTIONS/FOLLOW-UP: Return to Clinic in 1 year as long as ultrasound is negative.   I acknowledge that I have been informed and understand all the instructions given to me and received a copy. I do not have any more questions at this time, but understand that I may call the Specialty Clinic at [facility] at [phone] during business hours should I have any further questions or need assistance in obtaining follow-up care.    __________________________________________  _____________  __________ Signature of Patient or Authorized Representative            Date                   Time    __________________________________________ Nurse's Signature

## 2010-12-02 ENCOUNTER — Encounter: Payer: Self-pay | Admitting: Urgent Care

## 2010-12-02 ENCOUNTER — Ambulatory Visit (INDEPENDENT_AMBULATORY_CARE_PROVIDER_SITE_OTHER): Payer: Medicare Other | Admitting: Urgent Care

## 2010-12-02 DIAGNOSIS — K589 Irritable bowel syndrome without diarrhea: Secondary | ICD-10-CM

## 2010-12-02 DIAGNOSIS — R1310 Dysphagia, unspecified: Secondary | ICD-10-CM | POA: Insufficient documentation

## 2010-12-02 DIAGNOSIS — K219 Gastro-esophageal reflux disease without esophagitis: Secondary | ICD-10-CM

## 2010-12-02 MED ORDER — LUBIPROSTONE 8 MCG PO CAPS: 8.0000 ug | ORAL_CAPSULE | Freq: Two times a day (BID) | ORAL | Status: DC

## 2010-12-02 MED ORDER — PANTOPRAZOLE SODIUM 40 MG PO TBEC: 40.0000 mg | DELAYED_RELEASE_TABLET | Freq: Every day | ORAL | Status: DC

## 2010-12-02 NOTE — Progress Notes (Signed)
Primary Care Physician:  Kirk Ruths, MD Primary Gastroenterologist:  Dr. Jena Gauss  Chief Complaint  Patient presents with  . Constipation    HPI:  Sara Cohen is a 77 y.o. female here for abdominal pain. She has history of irritable bowel syndrome/constipation.   He is complaining of significant straining with stools which then causes abdominal pain.  BM daily now.  Pain better post defecation.  Taking MOM twice per week & stool softeners prn.  Stool softeners did not seem to help much.  Wt loss 4# in 6 months per our records, pt notes 40# over past several yrs.  Denies rectal bleeding or melena.  Appetite ok.  C/o bad indigestion & has not been taking anything for this.  C/o dysphagia w/ solids, especially cornbread about once per mo.  Denies odynophagia.  Last colonoscopy as below.  She had a CT scan of the chest abdomen and pelvis with IV contrast which showed->Bilateral thyroid nodules with calcifications, tiny hiatal hernia,Stable mild intrahepatic and extrahepatic ductal dilatation status post cholecystectomy, nothing to explain her abdominal pain.  Past Medical History  Diagnosis Date  . S/P lumpectomy of breast 5-6 yrs ago    right breast ca  . Breast cancer, left breast diagnosed 2 yrs ago    no intervention  . Thyroid goiter   . GERD (gastroesophageal reflux disease)   . DM (diabetes mellitus)   . Hyperlipidemia   . Emphysema   . Cancer     lt breast ca/dx 2010/lumpectomy  . Macular degeneration, left eye   . Invasive lobular carcinoma of breast, stage 1 11/24/2010  . 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 COPD (chronic obstructive pulmonary disease) 11/24/2010  . S/P colonoscopy 10/2001    INCOMPLETE; limited to flex sig, 1 polyp removed  . S/P endoscopy 10/2001    erosive reflux esophagitis  . S/P endoscopy 12/2003    Dr Jena Gauss, erosive reflux esophagitis  . S/P colonoscopy 12/2003    non-compliant left colon, diverticulosis  . Schatzki's ring      Past Surgical History  Procedure Date  . Breast lumpectomy     right breast  . Cholecystectomy   . Appendectomy   . Tubal ligation   . S/p hysterectomy     Current Outpatient Prescriptions  Medication Sig Dispense Refill  . ALPRAZolam (XANAX) 0.5 MG tablet Take 0.5 mg by mouth at bedtime as needed.        Marland Kitchen amLODipine (NORVASC) 5 MG tablet Take 5 mg by mouth daily.        Marland Kitchen dicyclomine (BENTYL) 10 MG capsule Take 10 mg by mouth daily.       . fish oil-omega-3 fatty acids 1000 MG capsule Take 2 g by mouth daily.        Marland Kitchen gemfibrozil (LOPID) 600 MG tablet Take 600 mg by mouth 2 (two) times daily before a meal.        . glipiZIDE (GLUCOTROL) 2.5 MG 24 hr tablet Take 2.5 mg by mouth daily.        Marland Kitchen lisinopril (PRINIVIL,ZESTRIL) 40 MG tablet Take 40 mg by mouth 2 (two) times daily.       . Multiple Vitamins-Minerals (EYE-VITE EXTRA PLUS LUTEIN) TABS Take by mouth.        . Nebulizer MISC by Does not apply route. prn       . trimethoprim (TRIMPEX) 100 MG tablet Take 100 mg by mouth. Takes 1 tablet on Mondays, Wednesdays and Fridays       .  warfarin (COUMADIN) 5 MG tablet Take 5 mg by mouth daily. Takes 5mg  every day but Fridays.  On Fridays takes 2.5mg       . lubiprostone (AMITIZA) 8 MCG capsule Take 1 capsule (8 mcg total) by mouth 2 (two) times daily with a meal.  62 capsule  2  . pantoprazole (PROTONIX) 40 MG tablet Take 1 tablet (40 mg total) by mouth daily.  30 tablet  5    Allergies as of 12/02/2010 - Review Complete 12/02/2010  Allergen Reaction Noted  . Penicillins  06/02/2010    Family History:  There is no known family history of colorectal carcinoma , liver disease, or inflammatory bowel disease.   History   Social History  . Marital Status: Widowed    Spouse Name: N/A    Number of Children: N/A  . Years of Education: N/A   Occupational History  . Not on file.   Social History Main Topics  . Smoking status: Current Everyday Smoker -- 1.0 packs/day    Types:  Cigarettes  . Smokeless tobacco: Not on file  . Alcohol Use: No  . Drug Use: No  . Sexually Active: Not on file  Review of Systems: Gen: Denies any fever, chills, sweats, anorexia, fatigue, weakness, malaise, weight loss, and sleep disorder CV: Denies chest pain, angina, palpitations, syncope, orthopnea, PND, peripheral edema, and claudication. Resp: Denies dyspnea at rest, dyspnea with exercise, cough, sputum, wheezing, coughing up blood, and pleurisy. GI: Denies vomiting blood, jaundice, and fecal incontinence. Derm: Denies rash, itching, dry skin, hives, moles, warts, or unhealing ulcers.  Psych: Denies depression, anxiety, memory loss, suicidal ideation, hallucinations, paranoia, and confusion. Heme: Denies bruising, bleeding, and enlarged lymph nodes.  Physical Exam: BP 162/77  Pulse 66  Temp(Src) 98.2 F (36.8 C) (Temporal)  Ht 5\' 2"  (1.575 m)  Wt 131 lb 9.6 oz (59.693 kg)  BMI 24.07 kg/m2 General:   Alert,  Well-developed, well-nourished, pleasant and cooperative in NAD.  Appears much younger than her stated age. Head:  Normocephalic and atraumatic. Eyes:  Sclera clear, no icterus.   Conjunctiva pink. Mouth:  No deformity or lesions.  OP pink/moist. Neck:  Supple; no masses or thyromegaly. Heart:  Regular rate and rhythm; no murmurs, clicks, rubs,  or gallops. Abdomen:  Soft, nontender and nondistended. No masses, hepatosplenomegaly or hernias noted. Normal bowel sounds, without guarding, and without rebound.   Msk:  Symmetrical without gross deformities. Normal posture. Pulses:  Normal pulses noted. Extremities:  Without clubbing or edema. Neurologic:  Alert and  oriented x4;  grossly normal neurologically. Skin:  Intact without significant lesions or rashes. Cervical Nodes:  No significant cervical adenopathy. Psych:  Alert and cooperative. Normal mood and affect.

## 2010-12-02 NOTE — Assessment & Plan Note (Addendum)
Sara Cohen is 77 y.o. caucasian female w/ hx GERD complicated by Schatzki's ring not currently on PPI.   Protonix 40mg  daily 30 min before breakfast Call if you decide you want to proceed with colonoscopy or EGD

## 2010-12-02 NOTE — Assessment & Plan Note (Addendum)
Likely culprit of her abdominal pain due to constipation. CT scan reassuring. Offered a colonoscopy for further evaluation.  She declined.   Begin Amitiza  Once or twice daily for constipation WITH FOOD Do not take if you get diarrhea

## 2010-12-02 NOTE — Patient Instructions (Addendum)
Begin Amitiza  Once or twice daily for constipation WITH FOOD Do not take if you get diarrhea Protonix 40mg  daily 30 min before breakfast Call if you decide you want to proceed with colonoscopy or EGD   Constipation in Adults Constipation is having fewer than 2 bowel movements per week. Usually, the stools are hard. As we grow older, constipation is more common. If you try to fix constipation with laxatives, the problem may get worse. This is because laxatives taken over a long period of time make the colon muscles weaker. A low-fiber diet, not taking in enough fluids, and taking some medicines may make these problems worse. MEDICATIONS THAT MAY CAUSE CONSTIPATION  Water pills (diuretics).  Calcium channel blockers (used to control blood pressure and for the heart).   Certain pain medicines (narcotics).   Anticholinergics.  Anti-inflammatory agents.   Antacids that contain aluminum.   DISEASES THAT CONTRIBUTE TO CONSTIPATION  Diabetes.  Parkinson's disease.   Dementia.   Stroke.  Depression.   Illnesses that cause problems with salt and water metabolism.   HOME CARE INSTRUCTIONS  Constipation is usually best cared for without medicines. Increasing dietary fiber and eating more fruits and vegetables is the best way to manage constipation.   Slowly increase fiber intake to 25 to 38 grams per day. Whole grains, fruits, vegetables, and legumes are good sources of fiber. A dietitian can further help you incorporate high-fiber foods into your diet.   Drink enough water and fluids to keep your urine clear or pale yellow.   A fiber supplement may be added to your diet if you cannot get enough fiber from foods.   Increasing your activities also helps improve regularity.   Suppositories, as suggested by your caregiver, will also help. If you are using antacids, such as aluminum or calcium containing products, it will be helpful to switch to products containing magnesium if your  caregiver says it is okay.   If you have been given a liquid injection (enema) today, this is only a temporary measure. It should not be relied on for treatment of longstanding (chronic) constipation.   Stronger measures, such as magnesium sulfate, should be avoided if possible. This may cause uncontrollable diarrhea. Using magnesium sulfate may not allow you time to make it to the bathroom.  SEEK IMMEDIATE MEDICAL CARE IF:  There is bright red blood in the stool.   The constipation stays for more than 4 days.   There is belly (abdominal) or rectal pain.   You do not seem to be getting better.   You have any questions or concerns.  MAKE SURE YOU:  Understand these instructions.   Will watch your condition.   Will get help right away if you are not doing well or get worse.  Document Released: 12/03/2003 Document Re-Released: 05/31/2009 [facility] Patient Information 2011 [place], [place] .Acid Reflux (GERD) Acid reflux is also called gastroesophageal reflux disease (GERD). Your stomach makes acid to help digest food. Acid reflux happens when acid from your stomach goes into the tube between your mouth and stomach (esophagus). Your stomach is protected from the acid, but this tube is not. When acid gets into the tube, it may cause a burning feeling in the chest (heartburn). Besides heartburn, other health problems can happen if the acid keeps going into the tube. Some causes of acid reflux include:  Being overweight.   Smoking.   Drinking alcohol.   Eating large meals.   Eating meals and then going to  bed right away.   Eating certain foods.   Increased stomach acid production.  HOME CARE  Take all medicine as told by your doctor.   You may need to:   Lose weight.   Avoid alcohol.   Quit smoking.   Do not eat big meals. It is better to eat smaller meals throughout the day.   Do not eat a meal and then nap or go to bed.   Sleep with your head higher than your  stomach.   Avoid foods that bother you.   You may need more tests, or you may need to see a special doctor.  GET HELP RIGHT AWAY IF:  You have chest pain that is different than before.   You have pain that goes to your arms, jaw, or between your shoulder blades.   You throw up (vomit) blood, dark brown liquid, or your throw up looks like coffee grounds.   You have trouble swallowing.   You have trouble breathing or cannot stop coughing.   You feel dizzy or pass out.   Your skin is cool, wet, and pale.   Your medicine is not helping.  MAKE SURE YOU:   Understand these instructions.   Will watch your condition.   Will get help right away if you are not doing well or get worse.  Document Released: 08/23/2007 Document Re-Released: 05/31/2009 [facility] Patient Information 2011 [place], [place].

## 2010-12-02 NOTE — Assessment & Plan Note (Signed)
Solid food dysphagia most likely secondary to Schatzki's ring. Offered dilation at EGD. Patient declined at this point. Chew food slowly. Call if she changes her mind. Resume PPI.

## 2010-12-05 NOTE — Progress Notes (Signed)
Cc to PCP 

## 2010-12-09 LAB — APTT: aPTT: 37

## 2010-12-09 LAB — PROTIME-INR: INR: 1.9 — ABNORMAL HIGH

## 2010-12-13 ENCOUNTER — Encounter (HOSPITAL_COMMUNITY): Payer: Self-pay

## 2010-12-13 ENCOUNTER — Other Ambulatory Visit: Payer: Self-pay

## 2010-12-13 ENCOUNTER — Emergency Department (HOSPITAL_COMMUNITY): Payer: Medicare Other

## 2010-12-13 ENCOUNTER — Emergency Department (HOSPITAL_COMMUNITY)
Admission: EM | Admit: 2010-12-13 | Discharge: 2010-12-13 | Disposition: A | Discharge status: Home / Self Care | Payer: Medicare Other | Attending: Emergency Medicine | Admitting: Emergency Medicine

## 2010-12-13 DIAGNOSIS — M549 Dorsalgia, unspecified: Secondary | ICD-10-CM | POA: Insufficient documentation

## 2010-12-13 DIAGNOSIS — R1110 Vomiting, unspecified: Secondary | ICD-10-CM | POA: Insufficient documentation

## 2010-12-13 DIAGNOSIS — F17200 Nicotine dependence, unspecified, uncomplicated: Secondary | ICD-10-CM | POA: Insufficient documentation

## 2010-12-13 DIAGNOSIS — R350 Frequency of micturition: Secondary | ICD-10-CM | POA: Insufficient documentation

## 2010-12-13 DIAGNOSIS — H3530 Unspecified macular degeneration: Secondary | ICD-10-CM | POA: Insufficient documentation

## 2010-12-13 DIAGNOSIS — R109 Unspecified abdominal pain: Secondary | ICD-10-CM | POA: Insufficient documentation

## 2010-12-13 DIAGNOSIS — K5900 Constipation, unspecified: Secondary | ICD-10-CM | POA: Insufficient documentation

## 2010-12-13 DIAGNOSIS — E119 Type 2 diabetes mellitus without complications: Secondary | ICD-10-CM | POA: Insufficient documentation

## 2010-12-13 DIAGNOSIS — C50919 Malignant neoplasm of unspecified site of unspecified female breast: Secondary | ICD-10-CM | POA: Insufficient documentation

## 2010-12-13 DIAGNOSIS — R197 Diarrhea, unspecified: Secondary | ICD-10-CM | POA: Insufficient documentation

## 2010-12-13 DIAGNOSIS — J438 Other emphysema: Secondary | ICD-10-CM | POA: Insufficient documentation

## 2010-12-13 DIAGNOSIS — K219 Gastro-esophageal reflux disease without esophagitis: Secondary | ICD-10-CM | POA: Insufficient documentation

## 2010-12-13 DIAGNOSIS — K5730 Diverticulosis of large intestine without perforation or abscess without bleeding: Secondary | ICD-10-CM | POA: Insufficient documentation

## 2010-12-13 DIAGNOSIS — E785 Hyperlipidemia, unspecified: Secondary | ICD-10-CM | POA: Insufficient documentation

## 2010-12-13 DIAGNOSIS — H9209 Otalgia, unspecified ear: Secondary | ICD-10-CM | POA: Insufficient documentation

## 2010-12-13 LAB — COMPREHENSIVE METABOLIC PANEL
ALT: 11 U/L (ref 0–35)
AST: 19 U/L (ref 0–37)
Albumin: 3.7 g/dL (ref 3.5–5.2)
Alkaline Phosphatase: 78 U/L (ref 39–117)
Chloride: 100 mEq/L (ref 96–112)
Potassium: 4.3 mEq/L (ref 3.5–5.1)
Total Bilirubin: 0.3 mg/dL (ref 0.3–1.2)

## 2010-12-13 LAB — URINALYSIS, ROUTINE W REFLEX MICROSCOPIC
Bilirubin Urine: NEGATIVE
Glucose, UA: NEGATIVE mg/dL
Hgb urine dipstick: NEGATIVE
Ketones, ur: NEGATIVE mg/dL
Leukocytes, UA: NEGATIVE
pH: 5.5 (ref 5.0–8.0)

## 2010-12-13 LAB — DIFFERENTIAL
Basophils Absolute: 0 10*3/uL (ref 0.0–0.1)
Basophils Relative: 0 % (ref 0–1)
Neutro Abs: 6.7 10*3/uL (ref 1.7–7.7)
Neutrophils Relative %: 81 % — ABNORMAL HIGH (ref 43–77)

## 2010-12-13 LAB — CBC
Hemoglobin: 14.1 g/dL (ref 12.0–15.0)
MCHC: 33.1 g/dL (ref 30.0–36.0)
RDW: 14.1 % (ref 11.5–15.5)
WBC: 8.2 10*3/uL (ref 4.0–10.5)

## 2010-12-13 MED ORDER — SODIUM CHLORIDE 0.9 % IV SOLN
INTRAVENOUS | Status: DC
  Administered 2010-12-13: 10:00:00 via INTRAVENOUS

## 2010-12-13 MED ORDER — DIPHENOXYLATE-ATROPINE 2.5-0.025 MG PO TABS
2.0000 | ORAL_TABLET | Freq: Once | ORAL | Status: AC
  Administered 2010-12-13: 2 via ORAL
  Filled 2010-12-13: qty 2

## 2010-12-13 NOTE — Consult Note (Addendum)
Sara Cohen MRN: [id number] DOB/AGE: 1920-09-09 77 y.o. Primary Care Physician:MCGOUGH,WILLIAM M, MD Admit date: 12/13/2010 Chief Complaint: Abdominal pain. HPI: This 77 year old lady gives a several week history of abdominal pain. She had seen the gastroenterologist office approximately 2 weeks ago for the same symptom. She also describes some diarrhea in the last few days. There is not been any vomiting or fever. There is no evidence of hematemesis or melena. When she was seen in the gastroenterologist office a couple weeks ago, she was offered colonoscopy, but she refused. She also describes weight loss in the last few months which has been unintentional. The emergency room physician Dr. Ignacia Palma has asked me to see this patient.  Past Medical History  Diagnosis Date  . S/P lumpectomy of breast 5-6 yrs ago    right breast ca  . Breast cancer, left breast diagnosed 2 yrs ago    no intervention  . Thyroid goiter   . GERD (gastroesophageal reflux disease)   . DM (diabetes mellitus)   . Hyperlipidemia   . Emphysema   . Cancer     lt breast ca/dx 2010/lumpectomy  . Macular degeneration, left eye   . Invasive lobular carcinoma of breast, stage 1 11/24/2010  . 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 COPD (chronic obstructive pulmonary disease) 11/24/2010  . S/P colonoscopy 10/2001    INCOMPLETE; limited to flex sig, 1 polyp removed  . S/P endoscopy 10/2001    erosive reflux esophagitis  . S/P endoscopy 12/2003    Dr Jena Gauss, erosive reflux esophagitis  . S/P colonoscopy 12/2003    non-compliant left colon, diverticulosis  . Schatzki's ring    Past Surgical History  Procedure Date  . Breast lumpectomy     right breast  . Cholecystectomy   . Appendectomy   . Tubal ligation   . S/p hysterectomy          Family history noncontributory. Social History:  reports that she has been smoking Cigarettes.  She has been smoking about 1 pack per day. She does not have any  smokeless tobacco history on file. She reports that she does not drink alcohol or use illicit drugs.   Allergies:  Allergies  Allergen Reactions  . Penicillins     REACTION: jittery    Medications Prior to Admission  Medication Dose Route Frequency Provider Last Rate Last Dose  . 0.9 %  sodium chloride infusion   Intravenous Continuous Carleene Cooper III, MD 160 mL/hr at 12/13/10 1017    . diphenoxylate-atropine (LOMOTIL) 2.5-0.025 MG per tablet 2 tablet  2 tablet Oral Once Carleene Cooper III, MD   2 tablet at 12/13/10 1016   Medications Prior to Admission  Medication Sig Dispense Refill  . ALPRAZolam (XANAX) 0.5 MG tablet Take 0.5 mg by mouth at bedtime as needed.        Marland Kitchen amLODipine (NORVASC) 5 MG tablet Take 5 mg by mouth daily.        Marland Kitchen dicyclomine (BENTYL) 10 MG capsule Take 10 mg by mouth daily.       . fish oil-omega-3 fatty acids 1000 MG capsule Take 2 g by mouth daily.        Marland Kitchen gemfibrozil (LOPID) 600 MG tablet Take 600 mg by mouth 2 (two) times daily before a meal.        . glipiZIDE (GLUCOTROL) 2.5 MG 24 hr tablet Take 2.5 mg by mouth daily.        Marland Kitchen lisinopril (PRINIVIL,ZESTRIL) 40  MG tablet Take 40 mg by mouth 2 (two) times daily.       Marland Kitchen lubiprostone (AMITIZA) 8 MCG capsule Take 1 capsule (8 mcg total) by mouth 2 (two) times daily with a meal.  62 capsule  2  . Multiple Vitamins-Minerals (EYE-VITE EXTRA PLUS LUTEIN) TABS Take by mouth.        . Nebulizer MISC by Does not apply route. prn       . pantoprazole (PROTONIX) 40 MG tablet Take 1 tablet (40 mg total) by mouth daily.  30 tablet  5  . trimethoprim (TRIMPEX) 100 MG tablet Take 100 mg by mouth. Takes 1 tablet on Mondays, Wednesdays and Fridays       . warfarin (COUMADIN) 5 MG tablet Take 5 mg by mouth daily. Takes 5mg  every day but Fridays.  On Fridays takes 2.5mg            VWU:[id number] from the symptoms mentioned above,there are no other symptoms referable to all systems reviewed.  Physical Exam: Blood pressure  121/46, pulse 70, temperature 97.8 F (36.6 C), temperature source Oral, resp. rate 20, height 5\' 2"  (1.575 m), weight 58.968 kg (130 lb), SpO2 96.00%. She looks systemically well. She is not toxic or septic. She does not appear to be in any acute pain whatsoever at the present time. In fact, she tells me her pain is improved since she has been in the emergency room. Her abdomen is soft and nontender. Is not distended. Bowel sounds are heard and are normal. There is no hepatosplenomegaly. There are no masses felt. Heart sounds are present and normal without murmurs. Lung fields are clear. She is alert and orientated without any focal neurologic signs.  Results for orders placed during the hospital encounter of 12/13/10 (from the past 48 hour(s))  CBC     Status: Normal   Collection Time   12/13/10  9:04 AM      Component Value Range Comment   WBC 8.2  4.0 - 10.5 (K/uL)    RBC 4.88  3.87 - 5.11 (MIL/uL)    Hemoglobin 14.1  12.0 - 15.0 (g/dL)    HCT 14.7  82.9 - 56.2 (%)    MCV 87.3  78.0 - 100.0 (fL)    MCH 28.9  26.0 - 34.0 (pg)    MCHC 33.1  30.0 - 36.0 (g/dL)    RDW 13.0  86.5 - 78.4 (%)    Platelets 185  150 - 400 (K/uL)   DIFFERENTIAL     Status: Abnormal   Collection Time   12/13/10  9:04 AM      Component Value Range Comment   Neutrophils Relative 81 (*) 43 - 77 (%)    Neutro Abs 6.7  1.7 - 7.7 (K/uL)    Lymphocytes Relative 9 (*) 12 - 46 (%)    Lymphs Abs 0.7  0.7 - 4.0 (K/uL)    Monocytes Relative 9  3 - 12 (%)    Monocytes Absolute 0.7  0.1 - 1.0 (K/uL)    Eosinophils Relative 2  0 - 5 (%)    Eosinophils Absolute 0.1  0.0 - 0.7 (K/uL)    Basophils Relative 0  0 - 1 (%)    Basophils Absolute 0.0  0.0 - 0.1 (K/uL)   COMPREHENSIVE METABOLIC PANEL     Status: Abnormal   Collection Time   12/13/10  9:04 AM      Component Value Range Comment   Sodium 134 (*) 135 - 145 (mEq/L)  Potassium 4.3  3.5 - 5.1 (mEq/L)    Chloride 100  96 - 112 (mEq/L)    CO2 23  19 - 32 (mEq/L)     Glucose, Bld 104 (*) 70 - 99 (mg/dL)    BUN 25 (*) 6 - 23 (mg/dL)    Creatinine, Ser 1.61  0.50 - 1.10 (mg/dL)    Calcium 9.8  8.4 - 10.5 (mg/dL)    Total Protein 7.4  6.0 - 8.3 (g/dL)    Albumin 3.7  3.5 - 5.2 (g/dL)    AST 19  0 - 37 (U/L)    ALT 11  0 - 35 (U/L)    Alkaline Phosphatase 78  39 - 117 (U/L)    Total Bilirubin 0.3  0.3 - 1.2 (mg/dL)    GFR calc non Af Amer 58 (*) >60 (mL/min)    GFR calc Af Amer >60  >60 (mL/min)   LIPASE, BLOOD     Status: Normal   Collection Time   12/13/10  9:04 AM      Component Value Range Comment   Lipase 44  11 - 59 (U/L)   URINALYSIS, ROUTINE W REFLEX MICROSCOPIC     Status: Normal   Collection Time   12/13/10 10:17 AM      Component Value Range Comment   Color, Urine YELLOW  YELLOW     Appearance CLEAR  CLEAR     Specific Gravity, Urine 1.020  1.005 - 1.030     pH 5.5  5.0 - 8.0     Glucose, UA NEGATIVE  NEGATIVE (mg/dL)    Hgb urine dipstick NEGATIVE  NEGATIVE     Bilirubin Urine NEGATIVE  NEGATIVE     Ketones, ur NEGATIVE  NEGATIVE (mg/dL)    Protein, ur NEGATIVE  NEGATIVE (mg/dL)    Urobilinogen, UA 0.2  0.0 - 1.0 (mg/dL)    Nitrite NEGATIVE  NEGATIVE     Leukocytes, UA NEGATIVE  NEGATIVE  MICROSCOPIC NOT DONE ON URINES WITH NEGATIVE PROTEIN, BLOOD, LEUKOCYTES, NITRITE, OR GLUCOSE <1000 mg/dL.     Ct Chest W Contrast  11/22/2010  *RADIOLOGY REPORT*  Clinical Data:  Breast cancer, abdominal pain, weight loss, status post hysterectomy, cholecystectomy, and appendectomy  CT CHEST, ABDOMEN AND PELVIS WITH CONTRAST  Technique:  Multidetector CT imaging of the chest, abdomen and pelvis was performed following the standard protocol during bolus administration of intravenous contrast.  Contrast: 100 ml Omnipaque-300 IV  Comparison:  CT abdomen pelvis dated 01/04/2010  CT CHEST  Findings:  Visualized thyroid is notable for bilateral thyroid nodules with calcifications.  The heart is normal in size.  No pericardial effusion. Atherosclerotic  calcifications of the aortic arch.  Small mediastinal lymph nodes which do not meet pathologic CT size criteria.  No suspicious hilar or axillary lymphadenopathy.  No suspicious pulmonary nodules.  No pleural effusion or pneumothorax.  Degenerative changes of the visualized thoracolumbar spine.  No focal osseous lesions.  IMPRESSION: No evidence of metastatic disease in the chest.  Bilateral thyroid nodules with calcifications.  CT ABDOMEN AND PELVIS  Findings:  Tiny hiatal hernia.  Focal fat along the falciform ligament.  Calcified granuloma posteriorly.  No suspicious hepatic lesions.  Spleen, pancreas, and adrenal glands are within normal limits.  Status post cholecystectomy.  Mild central intrahepatic ductal dilatation.  Common bile duct measures 10 mm, stable.  Bilateral renal cysts.  No hydronephrosis.  No evidence of bowel obstruction. Colonic diverticulosis, without associated inflammatory changes.  Atherosclerotic calcifications of the  abdominal aorta and branch vessels.  No abdominopelvic ascites.  No suspicious abdominopelvic lymphadenopathy.  Status post hysterectomy.  No adnexal masses.  Bladder is unremarkable.  Degenerative changes of the visualized thoracolumbar spine, most severe at L2-3.  No focal osseous lesions.  IMPRESSION: No evidence of metastatic disease in the abdomen/pelvis.  Status post cholecystectomy.  Stable mild intrahepatic and extrahepatic ductal dilatation.  Additional stable ancillary findings as above.  Original Report Authenticated By: Charline Bills, M.D.   Ct Abdomen Pelvis W Contrast  11/22/2010  *RADIOLOGY REPORT*  Clinical Data:  Breast cancer, abdominal pain, weight loss, status post hysterectomy, cholecystectomy, and appendectomy  CT CHEST, ABDOMEN AND PELVIS WITH CONTRAST  Technique:  Multidetector CT imaging of the chest, abdomen and pelvis was performed following the standard protocol during bolus administration of intravenous contrast.  Contrast: 100 ml  Omnipaque-300 IV  Comparison:  CT abdomen pelvis dated 01/04/2010  CT CHEST  Findings:  Visualized thyroid is notable for bilateral thyroid nodules with calcifications.  The heart is normal in size.  No pericardial effusion. Atherosclerotic calcifications of the aortic arch.  Small mediastinal lymph nodes which do not meet pathologic CT size criteria.  No suspicious hilar or axillary lymphadenopathy.  No suspicious pulmonary nodules.  No pleural effusion or pneumothorax.  Degenerative changes of the visualized thoracolumbar spine.  No focal osseous lesions.  IMPRESSION: No evidence of metastatic disease in the chest.  Bilateral thyroid nodules with calcifications.  CT ABDOMEN AND PELVIS  Findings:  Tiny hiatal hernia.  Focal fat along the falciform ligament.  Calcified granuloma posteriorly.  No suspicious hepatic lesions.  Spleen, pancreas, and adrenal glands are within normal limits.  Status post cholecystectomy.  Mild central intrahepatic ductal dilatation.  Common bile duct measures 10 mm, stable.  Bilateral renal cysts.  No hydronephrosis.  No evidence of bowel obstruction. Colonic diverticulosis, without associated inflammatory changes.  Atherosclerotic calcifications of the abdominal aorta and branch vessels.  No abdominopelvic ascites.  No suspicious abdominopelvic lymphadenopathy.  Status post hysterectomy.  No adnexal masses.  Bladder is unremarkable.  Degenerative changes of the visualized thoracolumbar spine, most severe at L2-3.  No focal osseous lesions.  IMPRESSION: No evidence of metastatic disease in the abdomen/pelvis.  Status post cholecystectomy.  Stable mild intrahepatic and extrahepatic ductal dilatation.  Additional stable ancillary findings as above.  Original Report Authenticated By: Charline Bills, M.D.   Dg Abd Acute W/chest  12/13/2010  *RADIOLOGY REPORT*  Clinical Data: Abdominal pain with bloating and diarrhea.  ACUTE ABDOMEN SERIES (ABDOMEN 2 VIEW & CHEST 1 VIEW)  Comparison:  Chest x-ray dated 07/29/2010 and a CT of the abdomen dated 11/22/2010  Findings: Emphysema.  Goiter.  Small hiatal hernia.  No free air in the abdomen.  No dilated large or small bowel.  Multiple diverticula in the distal colon.  No acute osseous abnormality.  IMPRESSION: Diverticulosis of the distal colon.  Emphysema.  Original Report Authenticated By: Gwynn Burly, M.D.   Impression: 1. Abdominal pain secondary to constipation. 2. Diverticular disease. 3. Weight loss of unclear etiology. Patient's white count is normal, LFTs and normal, she is not clinically or biochemically dehydrated. CT scan of her abdomen recently was negative for any malignant process. Abdominal x-ray does show significant constipation and stool.    Plan: 1. Patient has been given a prescription for Senokot-S one twice a day. 2. I've spoken with Dr. Luvenia Starch office and they will see her tomorrow at 9:30 AM. 3. Discharge patient home.  GOSRANI,NIMISH C 12/13/2010, 1:13 PM

## 2010-12-13 NOTE — ED Notes (Signed)
hospitalist here to eval 

## 2010-12-13 NOTE — ED Notes (Signed)
Pt up to bathroom to obtain urine. Unable to obtain urine but states her bowels moved

## 2010-12-13 NOTE — ED Provider Notes (Signed)
History   Chart scribed for Carleene Cooper III, MD by Enos Fling; the patient was seen in room [facility]19/[facility]; this patient's care was started at 9:04 AM.    CSN: [id number] Arrival date & time: 12/13/2010  8:20 AM  Chief Complaint  Patient presents with  . Diarrhea    HPI Sara Cohen is a 77 y.o. female who presents to the Emergency Department complaining of diarrhea. Pt reports diarrhea onset 2 days ago, approx 8 hours after starting a new medicine. Her PCP warned her of side effect of diarrhea and told her to stop taking it if diarrhea started. Pt c/o associated abd pain and swelling as well as one episode of vomiting 2 days ago. She has tried imodium with no relief. Pt reports h/o recent GI issues and is being evaluated through her PCP. Pt denies recent fever, new foods, or possibly spoiled foods. No recent travel or abx.  PCP McGough   Past Medical History  Diagnosis Date  . S/P lumpectomy of breast 5-6 yrs ago    right breast ca  . Breast cancer, left breast diagnosed 2 yrs ago    no intervention  . Thyroid goiter   . GERD (gastroesophageal reflux disease)   . DM (diabetes mellitus)   . Hyperlipidemia   . Emphysema   . Cancer     lt breast ca/dx 2010/lumpectomy  . Macular degeneration, left eye   . Invasive lobular carcinoma of breast, stage 1 11/24/2010  . 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 COPD (chronic obstructive pulmonary disease) 11/24/2010  . S/P colonoscopy 10/2001    INCOMPLETE; limited to flex sig, 1 polyp removed  . S/P endoscopy 10/2001    erosive reflux esophagitis  . S/P endoscopy 12/2003    Dr Jena Gauss, erosive reflux esophagitis  . S/P colonoscopy 12/2003    non-compliant left colon, diverticulosis  . Schatzki's ring     Past Surgical History  Procedure Date  . Breast lumpectomy     right breast  . Cholecystectomy   . Appendectomy   . Tubal ligation   . S/p hysterectomy     History reviewed. No pertinent family  history.  History  Substance Use Topics  . Smoking status: Current Everyday Smoker -- 1.0 packs/day    Types: Cigarettes  . Smokeless tobacco: Not on file  . Alcohol Use: No    OB History    Grav Para Term Preterm Abortions TAB SAB Ect Mult Living                 Review of Systems  Constitutional: Negative for fever.  HENT: Positive for ear pain.   Eyes:       "going blind" d/t macular degeneration  Respiratory: Positive for cough (occasional (not new for pt)).   Cardiovascular: Negative for leg swelling.       H/o a-fib, takes coumadin  Gastrointestinal: Positive for vomiting, abdominal pain, diarrhea and constipation (had problems with constipation prior to onset of diarrhea this week).  Genitourinary: Positive for frequency (not new for pt).  Musculoskeletal: Positive for back pain (not new for pt).  Skin: Negative for rash.  Neurological: Negative for seizures and syncope.    Allergies  Penicillins  Home Medications   Current Outpatient Rx  Name Route Sig Dispense Refill  . ALPRAZOLAM 0.5 MG PO TABS Oral Take 0.5 mg by mouth at bedtime as needed.      Marland Kitchen AMLODIPINE BESYLATE 5 MG PO TABS Oral Take  5 mg by mouth daily.      Marland Kitchen DICYCLOMINE HCL 10 MG PO CAPS Oral Take 10 mg by mouth daily.     . OMEGA-3 FATTY ACIDS 1000 MG PO CAPS Oral Take 2 g by mouth daily.      Marland Kitchen GEMFIBROZIL 600 MG PO TABS Oral Take 600 mg by mouth 2 (two) times daily before a meal.      . GLIPIZIDE 2.5 MG PO TB24 Oral Take 2.5 mg by mouth daily.      Marland Kitchen LISINOPRIL 40 MG PO TABS Oral Take 40 mg by mouth 2 (two) times daily.     . LUBIPROSTONE 8 MCG PO CAPS Oral Take 1 capsule (8 mcg total) by mouth 2 (two) times daily with a meal. 62 capsule 2  . EYE-VITE EXTRA PLUS LUTEIN PO TABS Oral Take by mouth.      . NEBULIZER MISC Does not apply by Does not apply route. prn     . PANTOPRAZOLE SODIUM 40 MG PO TBEC Oral Take 1 tablet (40 mg total) by mouth daily. 30 tablet 5  . TRIMETHOPRIM 100 MG PO TABS Oral  Take 100 mg by mouth. Takes 1 tablet on Mondays, Wednesdays and Fridays     . WARFARIN SODIUM 5 MG PO TABS Oral Take 5 mg by mouth daily. Takes 5mg  every day but Fridays.  On Fridays takes 2.5mg       Physical Exam    BP 121/46  Pulse 70  Temp(Src) 97.8 F (36.6 C) (Oral)  Resp 20  Ht 5\' 2"  (1.575 m)  Wt 130 lb (58.968 kg)  BMI 23.78 kg/m2  SpO2 96%  Physical Exam  Nursing note and vitals reviewed. Constitutional: She is oriented to person, place, and time. She appears well-developed and well-nourished. No distress.  HENT:  Head: Normocephalic.  Right Ear: Tympanic membrane normal.  Left Ear: Tympanic membrane normal.  Mouth/Throat: Oropharynx is clear and moist and mucous membranes are normal.  Eyes: Conjunctivae are normal. Pupils are equal, round, and reactive to light.  Neck: Normal range of motion. Neck supple.  Cardiovascular: Normal rate, regular rhythm and intact distal pulses.  Exam reveals no gallop and no friction rub.   No murmur heard. Pulmonary/Chest: Effort normal and breath sounds normal. She has no wheezes. She has no rales.  Abdominal: Soft. There is tenderness (mild diffuse).       High pitched bowel sounds; abd slightly swollen; multiple old, well-healed, abd incisions  Musculoskeletal: Normal range of motion. She exhibits no edema and no tenderness.  Neurological: She is alert and oriented to person, place, and time.  Skin: Skin is warm and dry. No rash noted.  Psychiatric: She has a normal mood and affect.    ED Course  Procedures - none  OTHER DATA REVIEWED: Nursing notes and vital signs reviewed. Prior records reviewed.   LABS / RADIOLOGY: Results for orders placed during the hospital encounter of 12/13/10  CBC      Component Value Range   WBC 8.2  4.0 - 10.5 (K/uL)   RBC 4.88  3.87 - 5.11 (MIL/uL)   Hemoglobin 14.1  12.0 - 15.0 (g/dL)   HCT 16.1  09.6 - 04.5 (%)   MCV 87.3  78.0 - 100.0 (fL)   MCH 28.9  26.0 - 34.0 (pg)   MCHC 33.1  30.0  - 36.0 (g/dL)   RDW 40.9  81.1 - 91.4 (%)   Platelets 185  150 - 400 (K/uL)  DIFFERENTIAL  Component Value Range   Neutrophils Relative 81 (*) 43 - 77 (%)   Neutro Abs 6.7  1.7 - 7.7 (K/uL)   Lymphocytes Relative 9 (*) 12 - 46 (%)   Lymphs Abs 0.7  0.7 - 4.0 (K/uL)   Monocytes Relative 9  3 - 12 (%)   Monocytes Absolute 0.7  0.1 - 1.0 (K/uL)   Eosinophils Relative 2  0 - 5 (%)   Eosinophils Absolute 0.1  0.0 - 0.7 (K/uL)   Basophils Relative 0  0 - 1 (%)   Basophils Absolute 0.0  0.0 - 0.1 (K/uL)  COMPREHENSIVE METABOLIC PANEL      Component Value Range   Sodium 134 (*) 135 - 145 (mEq/L)   Potassium 4.3  3.5 - 5.1 (mEq/L)   Chloride 100  96 - 112 (mEq/L)   CO2 23  19 - 32 (mEq/L)   Glucose, Bld 104 (*) 70 - 99 (mg/dL)   BUN 25 (*) 6 - 23 (mg/dL)   Creatinine, Ser 1.61  0.50 - 1.10 (mg/dL)   Calcium 9.8  8.4 - 09.6 (mg/dL)   Total Protein 7.4  6.0 - 8.3 (g/dL)   Albumin 3.7  3.5 - 5.2 (g/dL)   AST 19  0 - 37 (U/L)   ALT 11  0 - 35 (U/L)   Alkaline Phosphatase 78  39 - 117 (U/L)   Total Bilirubin 0.3  0.3 - 1.2 (mg/dL)   GFR calc non Af Amer 58 (*) >60 (mL/min)   GFR calc Af Amer >60  >60 (mL/min)  LIPASE, BLOOD      Component Value Range   Lipase 44  11 - 59 (U/L)  URINALYSIS, ROUTINE W REFLEX MICROSCOPIC      Component Value Range   Color, Urine YELLOW  YELLOW    Appearance CLEAR  CLEAR    Specific Gravity, Urine 1.020  1.005 - 1.030    pH 5.5  5.0 - 8.0    Glucose, UA NEGATIVE  NEGATIVE (mg/dL)   Hgb urine dipstick NEGATIVE  NEGATIVE    Bilirubin Urine NEGATIVE  NEGATIVE    Ketones, ur NEGATIVE  NEGATIVE (mg/dL)   Protein, ur NEGATIVE  NEGATIVE (mg/dL)   Urobilinogen, UA 0.2  0.0 - 1.0 (mg/dL)   Nitrite NEGATIVE  NEGATIVE    Leukocytes, UA NEGATIVE  NEGATIVE     Dg Abd Acute W/chest  12/13/2010  *RADIOLOGY REPORT*  Clinical Data: Abdominal pain with bloating and diarrhea.  ACUTE ABDOMEN SERIES (ABDOMEN 2 VIEW & CHEST 1 VIEW)  Comparison: Chest x-ray dated  07/29/2010 and a CT of the abdomen dated 11/22/2010  Findings: Emphysema.  Goiter.  Small hiatal hernia.  No free air in the abdomen.  No dilated large or small bowel.  Multiple diverticula in the distal colon.  No acute osseous abnormality.  IMPRESSION: Diverticulosis of the distal colon.  Emphysema.  Original Report Authenticated By: Gwynn Burly, M.D.     Date: 12/13/2010  Rate: 66  Rhythm: normal sinus rhythm  QRS Axis: normal  Intervals: normal  ST/T Wave abnormalities: normal  Conduction Disutrbances:none  Narrative Interpretation: Normal EKG  Old EKG Reviewed: unchanged     ED COURSE: Pt seen --> physical exam performed.  Lab workup ordered.  IV fluids and Lomotil ordered.  Lab tests showed CBC with WBC 8,200 with 81% neutrophils.  Chemistries WNL.  UA negative.  Abdominal films showed diverticulosis, no SBO, no free air.  Pt and her family and I discussed her situation.  She has persistant abdominal pain and diarrhea.  Will call [facility] to see pt.    All results reviewed and discussed, questions answered, pt agreeable with plan.  12:41PM Discussed case with Dr. Karilyn Cota who will see pt in ED and admit   IMPRESSION: 1. Abdominal pain   2. Diarrhea       MEDS GIVEN IN ED:  Medications  0.9 %  sodium chloride infusion (  Intravenous New Bag 12/13/10 1017)  diphenoxylate-atropine (LOMOTIL) 2.5-0.025 MG per tablet 2 tablet (2 tablet Oral Given 12/13/10 1016)     DISCHARGE MEDICATIONS: New Prescriptions   No medications on file     SCRIBE ATTESTATION: I personally performed the services described in this documentation, which was scribed in my presence. The recorded information has been reviewed and considered. Osvaldo Human, MD         Carleene Cooper III, MD 12/13/10 [phone]

## 2010-12-13 NOTE — ED Notes (Signed)
Dr. Karilyn Cota paged for Dr. Ignacia Palma

## 2010-12-13 NOTE — ED Notes (Signed)
Pt states she had diarrhea and vomiting Sunday. States she only has diarrhea now

## 2010-12-14 ENCOUNTER — Ambulatory Visit (INDEPENDENT_AMBULATORY_CARE_PROVIDER_SITE_OTHER): Payer: Medicare Other | Admitting: Urgent Care

## 2010-12-14 ENCOUNTER — Encounter: Payer: Self-pay | Admitting: Urgent Care

## 2010-12-14 VITALS — BP 129/66 | HR 74 | Temp 97.2°F | Ht 62.0 in | Wt 129.2 lb

## 2010-12-14 DIAGNOSIS — K589 Irritable bowel syndrome without diarrhea: Secondary | ICD-10-CM

## 2010-12-14 DIAGNOSIS — R1310 Dysphagia, unspecified: Secondary | ICD-10-CM

## 2010-12-14 DIAGNOSIS — R197 Diarrhea, unspecified: Secondary | ICD-10-CM | POA: Insufficient documentation

## 2010-12-14 DIAGNOSIS — R634 Abnormal weight loss: Secondary | ICD-10-CM

## 2010-12-14 DIAGNOSIS — K219 Gastro-esophageal reflux disease without esophagitis: Secondary | ICD-10-CM

## 2010-12-14 NOTE — Patient Instructions (Signed)
Take half your glucotrol the day before your colonoscopy & EGD. Hold your glucotrol the day of the procedure & bring all your meds with you to the hospital Keep colonoscopy & EGD as planned

## 2010-12-14 NOTE — Assessment & Plan Note (Signed)
Wt down 6# in past 6 mo, unintentional.  EGD/colonoscopy for further evaluation.

## 2010-12-14 NOTE — Assessment & Plan Note (Addendum)
Esophageal dysphagia.  EGD with possible esophageal dilation w/ Dr Jena Gauss in near future to look for esophageal web, ring, stricture.  On coumadin.  Requested last cardiology note for details regarding this as point does not know why she is taking. I will verify coumadin instructions w/ Dr Jena Gauss prior to procedures.  We did discuss the fact that she is at a slightly higher risk of GI bleeding given the fact that she is on Coumadin, however the benefits of remaining on the Coumadin outweigh the risk of bleeding. We discussed the life threatening nature of an embolic event. She agrees to remain on Coumadin for this procedure. She also understands that a second procedure may be required since she is on Coumadin if he shows signs of significant bleeding or is in need of significant intervention that cannot be performed while on Coumadin. Other risks discussed include reaction to the medication, perforation, and infection. She agrees with all the above and consent will be obtained.

## 2010-12-14 NOTE — Progress Notes (Addendum)
Primary Care Physician:  Kirk Ruths, MD Primary Gastroenterologist:  Dr. Jena Gauss  Chief Complaint  Patient presents with  . Diarrhea    HPI:  Sara Cohen is a 77 y.o. female here as a work-in for FU ER visit yesterday.  She was seen by me 9/14 for constipation, dysphagia, refractory GERD, abdominal pain & I offered EGD w/possible esophageal dilation & colonoscopy, she declined.  She took 1 amitiza & has had diarrhea ever since.  BM 4-5 this AM, yesterday 6-7 loose stools.  Previously had constipation.  Denies rectal bleeding or melena.  Denies fevers.  + chills.  Lots of gas & bloating.  Not taking protonix.  C/o heartburn & indigestion.  Took protonix for 1 week but didn't see a difference.  C/o N/V Sunday evening-looked like mucus.  Recent antibiotics 2-3 mo ago.  Bentyl once per day-no help. She had a CT scan of the chest abdomen and pelvis with IV contrast which showed->Bilateral thyroid nodules with calcifications, tiny hiatal hernia,Stable mild intrahepatic and extrahepatic ductal dilatation status post cholecystectomy, nothing to explain her abdominal pain. I reviewed CT w/ Dr Eppie Gibson (radiologist) celiac, SMA & IMA appear patent with nothing to suggest ischemia.  Recent Results (from the past 1008 hour(s))  CBC   Collection Time   12/13/10  9:04 AM      Component Value Range   WBC 8.2  4.0 - 10.5 (K/uL)   RBC 4.88  3.87 - 5.11 (MIL/uL)   Hemoglobin 14.1  12.0 - 15.0 (g/dL)   HCT 16.1  09.6 - 04.5 (%)   MCV 87.3  78.0 - 100.0 (fL)   MCH 28.9  26.0 - 34.0 (pg)   MCHC 33.1  30.0 - 36.0 (g/dL)   RDW 40.9  81.1 - 91.4 (%)   Platelets 185  150 - 400 (K/uL)  DIFFERENTIAL   Collection Time   12/13/10  9:04 AM      Component Value Range   Neutrophils Relative 81 (*) 43 - 77 (%)   Neutro Abs 6.7  1.7 - 7.7 (K/uL)   Lymphocytes Relative 9 (*) 12 - 46 (%)   Lymphs Abs 0.7  0.7 - 4.0 (K/uL)   Monocytes Relative 9  3 - 12 (%)   Monocytes Absolute 0.7  0.1 - 1.0 (K/uL)   Eosinophils  Relative 2  0 - 5 (%)   Eosinophils Absolute 0.1  0.0 - 0.7 (K/uL)   Basophils Relative 0  0 - 1 (%)   Basophils Absolute 0.0  0.0 - 0.1 (K/uL)  COMPREHENSIVE METABOLIC PANEL   Collection Time   12/13/10  9:04 AM      Component Value Range   Sodium 134 (*) 135 - 145 (mEq/L)   Potassium 4.3  3.5 - 5.1 (mEq/L)   Chloride 100  96 - 112 (mEq/L)   CO2 23  19 - 32 (mEq/L)   Glucose, Bld 104 (*) 70 - 99 (mg/dL)   BUN 25 (*) 6 - 23 (mg/dL)   Creatinine, Ser 7.82  0.50 - 1.10 (mg/dL)   Calcium 9.8  8.4 - 95.6 (mg/dL)   Total Protein 7.4  6.0 - 8.3 (g/dL)   Albumin 3.7  3.5 - 5.2 (g/dL)   AST 19  0 - 37 (U/L)   ALT 11  0 - 35 (U/L)   Alkaline Phosphatase 78  39 - 117 (U/L)   Total Bilirubin 0.3  0.3 - 1.2 (mg/dL)   GFR calc non Af Amer 58 (*) >60 (mL/min)  GFR calc Af Amer >60  >60 (mL/min)  LIPASE, BLOOD   Collection Time   12/13/10  9:04 AM      Component Value Range   Lipase 44  11 - 59 (U/L)  URINALYSIS, ROUTINE W REFLEX MICROSCOPIC   Collection Time   12/13/10 10:17 AM      Component Value Range   Color, Urine YELLOW  YELLOW    Appearance CLEAR  CLEAR    Specific Gravity, Urine 1.020  1.005 - 1.030    pH 5.5  5.0 - 8.0    Glucose, UA NEGATIVE  NEGATIVE (mg/dL)   Hgb urine dipstick NEGATIVE  NEGATIVE    Bilirubin Urine NEGATIVE  NEGATIVE    Ketones, ur NEGATIVE  NEGATIVE (mg/dL)   Protein, ur NEGATIVE  NEGATIVE (mg/dL)   Urobilinogen, UA 0.2  0.0 - 1.0 (mg/dL)   Nitrite NEGATIVE  NEGATIVE    Leukocytes, UA NEGATIVE  NEGATIVE   URINE CULTURE   Collection Time   12/13/10 10:17 AM      Component Value Range   Specimen Description URINE, CLEAN CATCH     Special Requests NONE     Setup Time [id number]     Colony Count 95,000 COLONIES/ML     Culture       Value: Multiple bacterial morphotypes present, none predominant. Suggest appropriate recollection if clinically indicated.   Report Status 12/15/2010 FINAL    GIARDIA/CRYPTOSPORIDIUM (EIA)   Collection Time   12/14/10  10:18 AM      Component Value Range   Giardia Screen (EIA) NEGATIVE     Cryptosporidium Screen (EIA) NEGATIVE    STOOL CULTURE   Collection Time   12/14/10 10:18 AM      Component Value Range   Preliminary Report No Suspicious Colonies, Continuing to Hold    URINALYSIS, ROUTINE W REFLEX MICROSCOPIC   Collection Time   12/22/10 10:02 AM      Component Value Range   Color, Urine STRAW (*) YELLOW    Appearance CLEAR  CLEAR    Specific Gravity, Urine 1.005  1.005 - 1.030    pH 6.0  5.0 - 8.0    Glucose, UA NEGATIVE  NEGATIVE (mg/dL)   Hgb urine dipstick NEGATIVE  NEGATIVE    Bilirubin Urine NEGATIVE  NEGATIVE    Ketones, ur NEGATIVE  NEGATIVE (mg/dL)   Protein, ur NEGATIVE  NEGATIVE (mg/dL)   Urobilinogen, UA 0.2  0.0 - 1.0 (mg/dL)   Nitrite NEGATIVE  NEGATIVE    Leukocytes, UA NEGATIVE  NEGATIVE   DIFFERENTIAL   Collection Time   12/22/10 10:10 AM      Component Value Range   Neutrophils Relative 65  43 - 77 (%)   Neutro Abs 5.6  1.7 - 7.7 (K/uL)   Lymphocytes Relative 23  12 - 46 (%)   Lymphs Abs 1.9  0.7 - 4.0 (K/uL)   Monocytes Relative 9  3 - 12 (%)   Monocytes Absolute 0.8  0.1 - 1.0 (K/uL)   Eosinophils Relative 3  0 - 5 (%)   Eosinophils Absolute 0.2  0.0 - 0.7 (K/uL)   Basophils Relative 1  0 - 1 (%)   Basophils Absolute 0.1  0.0 - 0.1 (K/uL)  CBC   Collection Time   12/22/10 10:10 AM      Component Value Range   WBC 8.5  4.0 - 10.5 (K/uL)   RBC 4.24  3.87 - 5.11 (MIL/uL)   Hemoglobin 12.3  12.0 - 15.0 (  g/dL)   HCT 16.1  09.6 - 04.5 (%)   MCV 86.3  78.0 - 100.0 (fL)   MCH 29.0  26.0 - 34.0 (pg)   MCHC 33.6  30.0 - 36.0 (g/dL)   RDW 40.9  81.1 - 91.4 (%)   Platelets 308  150 - 400 (K/uL)  COMPREHENSIVE METABOLIC PANEL   Collection Time   12/22/10 10:10 AM      Component Value Range   Sodium 136  135 - 145 (mEq/L)   Potassium 4.4  3.5 - 5.1 (mEq/L)   Chloride 102  96 - 112 (mEq/L)   CO2 24  19 - 32 (mEq/L)   Glucose, Bld 76  70 - 99 (mg/dL)   BUN 16  6 - 23  (mg/dL)   Creatinine, Ser 7.82  0.50 - 1.10 (mg/dL)   Calcium 95.6  8.4 - 10.5 (mg/dL)   Total Protein 7.3  6.0 - 8.3 (g/dL)   Albumin 3.1 (*) 3.5 - 5.2 (g/dL)   AST 14  0 - 37 (U/L)   ALT 9  0 - 35 (U/L)   Alkaline Phosphatase 94  39 - 117 (U/L)   Total Bilirubin 0.2 (*) 0.3 - 1.2 (mg/dL)   GFR calc non Af Amer 77 (*) >90 (mL/min)   GFR calc Af Amer 89 (*) >90 (mL/min)  LIPASE, BLOOD   Collection Time   12/22/10 10:10 AM      Component Value Range   Lipase 225 (*) 11 - 59 (U/L)  PROTIME-INR   Collection Time   12/22/10 10:44 AM      Component Value Range   Prothrombin Time 27.5 (*) 11.6 - 15.2 (seconds)   INR 2.51 (*) 0.00 - 1.49      Past Medical History  Diagnosis Date  . S/P lumpectomy of breast 5-6 yrs ago    right breast ca  . Breast cancer, left breast diagnosed 2 yrs ago    no intervention  . Thyroid goiter   . GERD (gastroesophageal reflux disease)   . DM (diabetes mellitus)   . Hyperlipidemia   . Emphysema   . Cancer     lt breast ca/dx 2010/lumpectomy  . Macular degeneration, left eye   . Invasive lobular carcinoma of breast, stage 1 11/24/2010  . 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 COPD (chronic obstructive pulmonary disease) 11/24/2010  . S/P colonoscopy 10/2001    INCOMPLETE; limited to flex sig, 1 polyp removed  . S/P endoscopy 10/2001    erosive reflux esophagitis  . S/P endoscopy 12/2003    Dr Jena Gauss, erosive reflux esophagitis  . S/P colonoscopy 12/2003    non-compliant left colon, diverticulosis  . Schatzki's ring   . IBS (irritable bowel syndrome)   . Atrial fibrillation     on coumadin (Dr H[facility])    Past Surgical History  Procedure Date  . Breast lumpectomy     right breast  . Cholecystectomy   . Appendectomy   . Tubal ligation   . S/p hysterectomy     Current Outpatient Prescriptions  Medication Sig Dispense Refill  . ALPRAZolam (XANAX) 0.5 MG tablet Take 0.5 mg by mouth at bedtime as needed. For anxiety        . amLODipine (NORVASC) 5 MG tablet Take 5 mg by mouth daily.        Marland Kitchen dicyclomine (BENTYL) 10 MG capsule Take 10 mg by mouth daily.       . fish oil-omega-3 fatty acids  1000 MG capsule Take 2 g by mouth daily.        Marland Kitchen gemfibrozil (LOPID) 600 MG tablet Take 600 mg by mouth 2 (two) times daily before a meal.        . glipiZIDE (GLUCOTROL) 2.5 MG 24 hr tablet Take 2.5 mg by mouth daily.        Marland Kitchen lisinopril (PRINIVIL,ZESTRIL) 40 MG tablet Take 40 mg by mouth 2 (two) times daily.       . Multiple Vitamins-Minerals (EYE-VITE EXTRA PLUS LUTEIN) TABS Take 1 tablet by mouth daily.       . Nebulizer MISC by Does not apply route. prn       . pantoprazole (PROTONIX) 40 MG tablet Take 1 tablet (40 mg total) by mouth daily.  30 tablet  5  . trimethoprim (TRIMPEX) 100 MG tablet Take 100 mg by mouth. Takes 1 tablet on Mondays, Wednesdays and Fridays       . warfarin (COUMADIN) 5 MG tablet Take 5 mg by mouth daily. Takes 5mg  every day but Fridays.  On Fridays takes 2.5mg       . AMITIZA 8 MCG capsule Take 8 mcg by mouth Twice daily.         Allergies as of 12/14/2010 - Review Complete 12/14/2010  Allergen Reaction Noted  . Penicillins  06/02/2010    Family History:There is no known family history of colorectal carcinoma , liver disease, or inflammatory bowel disease.  History   Social History  . Marital Status: Widowed    Spouse Name: N/A    Number of Children: 4  . Years of Education: N/A   Occupational History  . retired    Social History Main Topics  . Smoking status: Current Everyday Smoker -- 1.0 packs/day for 70 years    Types: Cigarettes  . Smokeless tobacco: Not on file  . Alcohol Use: No  . Drug Use: No  . Sexually Active: Not on file   Other Topics Concern  . Not on file   Social History Narrative   Lives w/ youngest son   Review of Systems: See HPI, otherwise negative ROS  Physical Exam: BP 129/66  Pulse 74  Temp(Src) 97.2 F (36.2 C) (Temporal)  Ht 5\' 2"  (1.575 m)   Wt 129 lb 3.2 oz (58.605 kg)  BMI 23.63 kg/m2 General:   Alert,  Well-developed, well-nourished, pleasant and cooperative in NAD.  Appears much younger than her stated age.  Accompanied by her son. Head:  Normocephalic and atraumatic. Eyes:  Sclera clear, no icterus.   Conjunctiva pink. Mouth:  No deformity or lesions.  OP pink/moist. Neck:  Supple; no masses or thyromegaly. Heart:  Regular rate and rhythm; no murmurs, clicks, rubs,  or gallops. Abdomen:  Soft, nontender and nondistended. No masses, hepatosplenomegaly or hernias noted. Normal bowel sounds, without guarding, and without rebound.    Msk:  Symmetrical without gross deformities. Normal posture. Pulses:  Normal pulses noted. Extremities:  Without clubbing or edema. Neurologic:  Alert and  oriented x4;  grossly normal neurologically. Skin:  Intact without significant lesions or rashes. Cervical Nodes:  No significant cervical adenopathy. Psych:  Alert and cooperative. Normal mood and affect.

## 2010-12-14 NOTE — Assessment & Plan Note (Addendum)
Hx IBS.  Previously constipated, now w/ severe diarrhea.  Amitiza has been discontinued (she only had 1 dose so this not culprit).  Recent antibiotics.  Colonoscopy w/ Dr Jena Gauss in near future.  Standard diarrhea instructions.  Plenty fluids. Stool studies to r/o c diff, infectious process  I have discussed risks & benefits which include, but are not limited to, bleeding, infection, perforation & drug reaction.  The patient agrees with this plan & written consent will be obtained.     Take half your glucotrol the day before your colonoscopy & EGD. Hold your glucotrol the day of the procedure & bring all your meds with you to the hospital Keep colonoscopy & EGD as planned

## 2010-12-14 NOTE — Assessment & Plan Note (Addendum)
Hx GERD, Schatzki's ring, not on PPI consistently as she feels it is ineffective.  EGD as planned.

## 2010-12-15 LAB — URINE CULTURE
Colony Count: 95000
Culture  Setup Time: 201209251345

## 2010-12-15 NOTE — Progress Notes (Signed)
Cc to PCP 

## 2010-12-19 DIAGNOSIS — K859 Acute pancreatitis without necrosis or infection, unspecified: Secondary | ICD-10-CM

## 2010-12-19 HISTORY — DX: Acute pancreatitis without necrosis or infection, unspecified: K85.90

## 2010-12-22 ENCOUNTER — Telehealth: Payer: Self-pay | Admitting: Urgent Care

## 2010-12-22 ENCOUNTER — Emergency Department (HOSPITAL_COMMUNITY)
Admission: EM | Admit: 2010-12-22 | Discharge: 2010-12-22 | Disposition: A | Discharge status: Home / Self Care | Payer: Medicare Other | Attending: Emergency Medicine | Admitting: Emergency Medicine

## 2010-12-22 ENCOUNTER — Emergency Department (HOSPITAL_COMMUNITY): Payer: Medicare Other

## 2010-12-22 DIAGNOSIS — E119 Type 2 diabetes mellitus without complications: Secondary | ICD-10-CM | POA: Insufficient documentation

## 2010-12-22 DIAGNOSIS — K5730 Diverticulosis of large intestine without perforation or abscess without bleeding: Secondary | ICD-10-CM | POA: Insufficient documentation

## 2010-12-22 DIAGNOSIS — I2510 Atherosclerotic heart disease of native coronary artery without angina pectoris: Secondary | ICD-10-CM | POA: Insufficient documentation

## 2010-12-22 DIAGNOSIS — R1110 Vomiting, unspecified: Secondary | ICD-10-CM | POA: Insufficient documentation

## 2010-12-22 DIAGNOSIS — I10 Essential (primary) hypertension: Secondary | ICD-10-CM | POA: Insufficient documentation

## 2010-12-22 DIAGNOSIS — R109 Unspecified abdominal pain: Secondary | ICD-10-CM | POA: Insufficient documentation

## 2010-12-22 DIAGNOSIS — K859 Acute pancreatitis without necrosis or infection, unspecified: Secondary | ICD-10-CM | POA: Insufficient documentation

## 2010-12-22 LAB — CBC
HCT: 36.6 % (ref 36.0–46.0)
Hemoglobin: 12.3 g/dL (ref 12.0–15.0)
MCHC: 33.6 g/dL (ref 30.0–36.0)
WBC: 8.5 10*3/uL (ref 4.0–10.5)

## 2010-12-22 LAB — COMPREHENSIVE METABOLIC PANEL
BUN: 16 mg/dL (ref 6–23)
CO2: 24 mEq/L (ref 19–32)
Calcium: 10.3 mg/dL (ref 8.4–10.5)
Creatinine, Ser: 0.66 mg/dL (ref 0.50–1.10)
GFR calc Af Amer: 89 mL/min — ABNORMAL LOW (ref >90)
GFR calc non Af Amer: 77 mL/min — ABNORMAL LOW (ref >90)
Glucose, Bld: 76 mg/dL (ref 70–99)
Sodium: 136 mEq/L (ref 135–145)
Total Protein: 7.3 g/dL (ref 6.0–8.3)

## 2010-12-22 LAB — DIFFERENTIAL
Basophils Absolute: 0.1 10*3/uL (ref 0.0–0.1)
Lymphocytes Relative: 23 % (ref 12–46)
Lymphs Abs: 1.9 10*3/uL (ref 0.7–4.0)
Monocytes Absolute: 0.8 10*3/uL (ref 0.1–1.0)
Neutro Abs: 5.6 10*3/uL (ref 1.7–7.7)

## 2010-12-22 LAB — URINALYSIS, ROUTINE W REFLEX MICROSCOPIC
Glucose, UA: NEGATIVE mg/dL
Hgb urine dipstick: NEGATIVE
Specific Gravity, Urine: 1.005 (ref 1.005–1.030)

## 2010-12-22 LAB — LIPASE, BLOOD: Lipase: 225 U/L — ABNORMAL HIGH (ref 11–59)

## 2010-12-22 MED ORDER — IOHEXOL 300 MG/ML  SOLN
100.0000 mL | Freq: Once | INTRAMUSCULAR | Status: AC | PRN
  Administered 2010-12-22: 100 mL via INTRAVENOUS

## 2010-12-22 NOTE — Progress Notes (Signed)
Sara Cohen was seen in ED for pancreatitis (new diagnosis) Please call her Let bring her in for OV w/RMR or me on RMR day next week if possible In the interim, let's get cardiology info please.

## 2010-12-22 NOTE — Progress Notes (Signed)
Please call pt We cannot proceed fwd without more info about her coumadin Call her cardiologist & see if we can get information about why she is taking her coumadin please? Thanks

## 2010-12-23 NOTE — Progress Notes (Signed)
Tried to call pt number busy 

## 2010-12-26 NOTE — Progress Notes (Signed)
Please try to call pt again to arrange OV  If not available, send letter. Thanks

## 2010-12-27 LAB — GIARDIA/CRYPTOSPORIDIUM (EIA): Giardia Screen (EIA): NEGATIVE

## 2010-12-27 NOTE — Progress Notes (Signed)
Please cancel tcs/egd.  She needs OV as below first. Thanks

## 2010-12-27 NOTE — Progress Notes (Signed)
Pt aware, she went to see her pcp yesterday as a follow up from ED. She is aware she needs appt here. SS to make appt.   Pt sees cardiologist at [facility]. Sending requesting to them for more info.

## 2010-12-27 NOTE — Progress Notes (Signed)
LW to request last ov from Cardiologist. Pt could not remember which doctor she sees or why she's taking coumadin.

## 2010-12-27 NOTE — Progress Notes (Signed)
Received last ov from Cardiologist. Pt has Afib. Ov note on KJ desk. SS to schedule pt appt.

## 2010-12-27 NOTE — Telephone Encounter (Signed)
A user error has taken place: encounter opened in error, closed for administrative reasons.

## 2010-12-28 ENCOUNTER — Encounter: Payer: Self-pay | Admitting: Internal Medicine

## 2010-12-28 NOTE — Progress Notes (Signed)
TCS/EGD cancelled

## 2010-12-28 NOTE — Progress Notes (Signed)
Quick Note:  Please call lab to check on c diff thanks ______

## 2010-12-29 NOTE — Progress Notes (Signed)
Quick Note:  Please call lab to check on status of c diff Thanks ______

## 2010-12-30 ENCOUNTER — Encounter: Payer: Self-pay | Admitting: Urgent Care

## 2010-12-30 LAB — CLOSTRIDIUM DIFFICILE BY PCR: Toxigenic C. Difficile by PCR: NOT DETECTED

## 2011-01-02 ENCOUNTER — Encounter: Payer: Self-pay | Admitting: Urgent Care

## 2011-01-02 ENCOUNTER — Ambulatory Visit (INDEPENDENT_AMBULATORY_CARE_PROVIDER_SITE_OTHER): Payer: Medicare Other | Admitting: Urgent Care

## 2011-01-02 VITALS — BP 131/70 | HR 80 | Temp 98.5°F | Ht 62.0 in | Wt 131.4 lb

## 2011-01-02 DIAGNOSIS — K859 Acute pancreatitis without necrosis or infection, unspecified: Secondary | ICD-10-CM

## 2011-01-02 DIAGNOSIS — R1310 Dysphagia, unspecified: Secondary | ICD-10-CM

## 2011-01-02 DIAGNOSIS — K219 Gastro-esophageal reflux disease without esophagitis: Secondary | ICD-10-CM

## 2011-01-02 NOTE — Progress Notes (Signed)
Referring Provider: Kirk Ruths, MD Primary Care Physician:  Kirk Ruths, MD Primary Gastroenterologist:  Dr. Jena Gauss  Chief Complaint  Patient presents with  . Follow-up    HPI:  Sara Cohen is a 77 y.o. female here for follow up for pancreatitis.  She had been seen by [place] for chronic abdominal pain & diarrhea.  Plans were to have EGD & colonoscopy.  She later ended up in ER w/ severe pain & elevated lipase.  Repeat CT showed pancreatitis as below.  No pain in 2 days.  Denies nausea or vomiting,  Denies diarrhea now.  Appetite ok.  Eating well.  No etoh.    CT abd/pelvis w/ IV contrast->There is stable mild intrahepatic biliary ductal dilatation and stable extrahepatic biliary ductal dilatation. Extrahepatic bile duct measures 15 mm at the porta hepatis of the common bile duct measures 11 mm at the pancreatic head, and tapers distally. There are no focal liver lesions. Stable single calcification of the right hepatic lobe may reflect prior granulomatous disease. The spleen is normal in size and enhancement. Linear left adrenal gland calcification is stable. Right adrenal gland normal. Posterior pancreatic body and tail are slightly more full in appearance compared to recent prior study and there is peripancreatic fat stranding. Small amount of fluid tracks inferior to the distal pancreas. This stranding and fluid was not present on recent prior examination. No pancreatic mass or calcification. There is no pancreatic duct dilatation. There is no evidence of pancreatic pseudocyst or pancreatic necrosis. A few bilateral low density renal lesions are stable and likely cysts. There is no hydronephrosis or suspicious renal mass. Bowel loops are within normal limits for caliber. No evidence of bowel obstruction. There is marked diverticular disease of the sigmoid colon, characterized by muscular wall thickening and multiple diverticula. No evidence of acute diverticulitis. Hysterectomy noted.  Urinary bladder normal. No adnexal mass or free pelvic fluid. No abdominal or pelvic lymphadenopathy. Extensive atherosclerotic calcification and stable ectasia of the abdominal aorta. Negative for aneurysm. There are degenerative changes about the pubic symphysis. Severe degenerative disc disease at L2-L3 is stable. Posterior disc osteophyte complex at L5-S1 is stable. No fracture or suspicious osseous lesion.  IMPRESSION:  1. CT findings consistent with mild acute pancreatitis.  2. Stable intra and extrahepatic biliary ductal dilatation. This  may be due to the post cholecystectomy state.  3. Extensive sigmoid colon diverticulosis. No evidence of acute  associated for inflammatory change.  4. Stable atherosclerotic disease of the aortoiliac vasculature.    Past Medical History  Diagnosis Date  . S/P lumpectomy of breast 5-6 yrs ago    right breast ca  . Breast cancer, left breast diagnosed 2 yrs ago    no intervention  . Thyroid goiter   . GERD (gastroesophageal reflux disease)   . DM (diabetes mellitus)   . Hyperlipidemia   . Emphysema   . Cancer     lt breast ca/dx 2010/lumpectomy  . Macular degeneration, left eye   . Invasive lobular carcinoma of breast, stage 1 11/24/2010  . 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 COPD (chronic obstructive pulmonary disease) 11/24/2010  . S/P colonoscopy 10/2001    INCOMPLETE; limited to flex sig, 1 polyp removed  . S/P endoscopy 10/2001    erosive reflux esophagitis  . S/P endoscopy 12/2003    Dr Jena Gauss, erosive reflux esophagitis  . S/P colonoscopy 12/2003    non-compliant left colon, diverticulosis  . Schatzki's ring   .  IBS (irritable bowel syndrome)   . Atrial fibrillation     on coumadin (Dr H[facility])    Past Surgical History  Procedure Date  . Breast lumpectomy     right breast  . Cholecystectomy   . Appendectomy   . Tubal ligation   . S/p hysterectomy     Current Outpatient Prescriptions  Medication Sig  Dispense Refill  . ALPRAZolam (XANAX) 0.5 MG tablet Take 0.5 mg by mouth at bedtime as needed. For anxiety      . AMITIZA 8 MCG capsule Take 8 mcg by mouth Twice daily.       Marland Kitchen amLODipine (NORVASC) 5 MG tablet Take 5 mg by mouth daily.        Marland Kitchen dicyclomine (BENTYL) 10 MG capsule Take 10 mg by mouth daily.       . fish oil-omega-3 fatty acids 1000 MG capsule Take 2 g by mouth daily.        Marland Kitchen gemfibrozil (LOPID) 600 MG tablet Take 600 mg by mouth 2 (two) times daily before a meal.        . glipiZIDE (GLUCOTROL) 2.5 MG 24 hr tablet Take 2.5 mg by mouth daily.        Marland Kitchen lisinopril (PRINIVIL,ZESTRIL) 40 MG tablet Take 40 mg by mouth 2 (two) times daily.       . Multiple Vitamins-Minerals (EYE-VITE EXTRA PLUS LUTEIN) TABS Take 1 tablet by mouth daily.       . Nebulizer MISC by Does not apply route. prn       . pantoprazole (PROTONIX) 40 MG tablet Take 1 tablet (40 mg total) by mouth daily.  30 tablet  5  . trimethoprim (TRIMPEX) 100 MG tablet Take 100 mg by mouth. Takes 1 tablet on Mondays, Wednesdays and Fridays       . warfarin (COUMADIN) 5 MG tablet Take 5 mg by mouth daily. Takes 5mg  every day but Fridays.  On Fridays takes 2.5mg       . ondansetron (ZOFRAN) 4 MG tablet Take 4 mg by mouth every 8 (eight) hours as needed.       Marland Kitchen oxyCODONE-acetaminophen (PERCOCET) 5-325 MG per tablet Take 1 tablet by mouth every 4 (four) hours as needed.         Allergies as of 01/02/2011 - Review Complete 01/02/2011  Allergen Reaction Noted  . Penicillins  06/02/2010    Review of Systems: Gen: Denies any fever, chills, sweats, anorexia, fatigue, weakness, malaise, weight loss, and sleep disorder CV: Denies chest pain, angina, palpitations, syncope, orthopnea, PND, peripheral edema, and claudication. Resp: Denies dyspnea at rest, dyspnea with exercise, cough, sputum, wheezing, coughing up blood, and pleurisy. GI: Denies vomiting blood, jaundice, and fecal incontinence.   Denies dysphagia or odynophagia. Derm:  Denies rash, itching, dry skin, hives, moles, warts, or unhealing ulcers.  Psych: Denies depression, anxiety, memory loss, suicidal ideation, hallucinations, paranoia, and confusion. Heme: Denies bruising, bleeding, and enlarged lymph nodes.  Physical Exam: BP 131/70  Pulse 80  Temp(Src) 98.5 F (36.9 C) (Temporal)  Ht 5\' 2"  (1.575 m)  Wt 131 lb 6.4 oz (59.603 kg)  BMI 24.03 kg/m2 General:   Alert,  Well-developed, well-nourished, pleasant and cooperative in NAD Head:  Normocephalic and atraumatic. Eyes:  Sclera clear, no icterus.   Conjunctiva pink. Mouth:  No deformity or lesions, dentition normal. Neck:  Supple; no masses or thyromegaly. Heart:  Regular rate and rhythm; no murmurs, clicks, rubs,  or gallops. Abdomen:  Soft, nontender and nondistended.  No masses, hepatosplenomegaly or hernias noted. Normal bowel sounds, without guarding, and without rebound.   Msk:  Symmetrical without gross deformities. Normal posture. Pulses:  Normal pulses noted. Extremities:  Without clubbing or edema. Neurologic:  Alert and  oriented x4;  grossly normal neurologically. Skin:  Intact without significant lesions or rashes. Cervical Nodes:  No significant cervical adenopathy. Psych:  Alert and cooperative. Normal mood and affect.

## 2011-01-02 NOTE — Patient Instructions (Addendum)
Call if you develop abdominal pain again.  Acute Pancreatitis The pancreas is a large gland located behind your stomach. It produces (secretes) enzymes. These enzymes help digest food. It also releases the hormones glucagon and insulin. These hormones help regulate blood sugar. When the pancreas becomes inflamed, the disease is called pancreatitis. Inflammation of the pancreas occurs when enzymes from the pancreas begin attacking and digesting the pancreas. CAUSES Most cases of sudden onset (acute) pancreatitis are caused by:  Alcohol abuse.   Gallstones.  Other less common causes are:  Some medications.   Exposure to certain chemicals   Infection.   Damage caused by an accident (trauma).   Surgery of the belly (abdomen).  SYMPTOMS Acute pancreatitis usually begins with pain in the upper abdomen and may radiate to the back. This pain may last a couple days. The constant pain varies from mild to severe. The acute form of this disease may vary from mild, nonspecific abdominal pain to profound shock with coma. About 1 in 5 cases are severe. These patients become dehydrated and develop low blood pressure. In severe cases, bleeding into the pancreas can lead to shock and death. The lungs, heart, and kidneys may fail. DIAGNOSIS Your caregiver will form a clinical opinion after giving you an exam. Laboratory work is used to confirm this diagnosis. Often, a digestive enzyme from the pancreas (serum amylase) and other enzymes are elevated. Sugars and fats (lipids) in the blood may be elevated. There may also be changes in the following levels: calcium, magnesium, potassium, chloride and bicarbonate (chemicals in the blood). X-rays, a CT scan, or ultrasound of your abdomen may be necessary to search for other causes of your abdominal pain. TREATMENT Most pancreatitis requires treatment of symptoms. Most acute attacks last a couple of days. Your caregiver can discuss the treatment options with  you.  If complications occur, hospitalization may be necessary for pain control and intravenous (IV) fluid replacement.   Sometimes, a tube may be put into the stomach to control vomiting.   Food may not be allowed for 3 to 4 days. This gives the pancreas time to rest. Giving the pancreas a rest means there is no stimulation that would produce more enzymes and cause more damage.   Medicines (antibiotics) that kill germs may be given if infection is the cause.   Sometimes, surgery may be required.   Following an acute attack, your caregiver will determine the cause, if possible, and offer suggestions to prevent recurrences.  HOME CARE INSTRUCTIONS  Eat smaller, more frequent meals. This reduces the amount of digestive juices the pancreas produces.   Decrease the amount of fat in your diet. This may help reduce loose, diarrheal stools.   Drink enough water and fluids to keep your urine clear or pale yellow. This is to avoid dehydration which can cause increased pain.   Talk to your caregiver about pain relievers or other medicines that may help.   Avoid anything that may have triggered your pancreatitis (for example, alcohol).   Follow the diet advised by your caregiver. Do not advance the diet too soon.   Take medicines as prescribed.   Get plenty of rest.   Check your blood sugar at home as directed by your caregiver.   If your caregiver has given you a follow-up appointment, it is very important to keep that appointment. Not keeping the appointment could result in a lasting (chronic) or permanent injury, pain, and disability. If there is any problem keeping the appointment,  you must call to reschedule.  SEEK MEDICAL CARE IF:  You are not recovering in the time described by your caregiver.   You have persistent pain, weakness, or feel sick to your stomach (nauseous).   You have recovered and then have another bout of pain.  SEEK IMMEDIATE MEDICAL CARE IF:  You are unable to  eat or keep fluids down.   Your pain increases a lot or changes.   You have an oral temperature above 101, not controlled by medicine.   Your skin or the white part of your eyes look yellow (jaundice).   You develop vomiting.   You feel dizzy or faint.   Your blood sugar is high (over 300).  MAKE SURE YOU:  Understand these instructions.   Will watch your condition.   Will get help right away if you are not doing well or get worse.  Document Released: 03/06/2005 Document Re-Released: 08/24/2009 [facility] Patient Information 2011 [place], [place].

## 2011-01-02 NOTE — Assessment & Plan Note (Signed)
Recent acute pancreatitis.  ? Need for further imaging via EUS to r/o occult mass.  Will discuss further w/ Dr Jena Gauss.  S/p cholecystectomy many yrs ago.

## 2011-01-02 NOTE — Assessment & Plan Note (Signed)
GERD.  Well controlled at this time on protonix 40mg  daily.  Hx Schatzki's ring.

## 2011-01-02 NOTE — Assessment & Plan Note (Signed)
Pt does not want to pursue EGD with esophageal dilation at this time being on coumadin & recovering from pancreatitis.  She will let us know if symptoms worsen.

## 2011-01-03 NOTE — Progress Notes (Signed)
CD sent a request to Chales Abrahams to schedule pt.

## 2011-01-03 NOTE — Progress Notes (Signed)
Discussed w/ Dr Jena Gauss. Pt will need EUS w/ Dr Christella Hartigan in 4 weeks [id number] Please arrange-I already discussed w/ pt at OV. Let me know if she has any questions. Thanks

## 2011-01-04 ENCOUNTER — Telehealth: Payer: Self-pay

## 2011-01-04 DIAGNOSIS — K859 Acute pancreatitis without necrosis or infection, unspecified: Secondary | ICD-10-CM

## 2011-01-04 NOTE — Telephone Encounter (Signed)
Message copied by Donata Duff on Wed Jan 04, 2011  8:09 AM ------      Message from: Rachael Fee      Created: Tue Jan 03, 2011  5:24 PM       She needs upper EUS, radial +/- linear, 60 min, for recent pancreatitis.  She will need to hold coumadin for 5 days prior to EUS, please contact her PCP about holding that.            Next available [facility] propofol + day, thanks            dj                  ----- Message -----         From: Chales Abrahams, CMA         Sent: 01/03/2011   4:39 PM           To: Rob Bunting, MD                        ----- Message -----         From: Sula Soda         Sent: 01/03/2011   4:23 PM           To: Chales Abrahams, CMA            Pt needs EUS w/ Dr Christella Hartigan in 4 weeks, re: pancreatitis                  Thanks,            Cherene Julian      Pt Care Coordinator       [facility]

## 2011-01-04 NOTE — Progress Notes (Signed)
Cc to PCP 

## 2011-01-05 NOTE — Telephone Encounter (Signed)
Need to schedule pt when the Nov schedule is out

## 2011-01-06 NOTE — Telephone Encounter (Signed)
No available appt in Nov will need to schedule in Dec

## 2011-01-10 ENCOUNTER — Ambulatory Visit: Admit: 2011-01-10 | Payer: Self-pay | Admitting: Internal Medicine

## 2011-01-10 SURGERY — COLONOSCOPY WITH ESOPHAGOGASTRODUODENOSCOPY (EGD)
Anesthesia: Moderate Sedation

## 2011-01-11 NOTE — Telephone Encounter (Signed)
Pt needs to be instructed and meds reviewed for EUS

## 2011-01-12 NOTE — Telephone Encounter (Signed)
Pt aware and meds reviewed pt also sent a copy of her instructions

## 2011-01-16 ENCOUNTER — Telehealth: Payer: Self-pay

## 2011-01-16 NOTE — Telephone Encounter (Signed)
Dr Christella Hartigan I received a response from Dr Regino Schultze he said the pt can hold coumadin for 3 days is that ok?

## 2011-01-17 NOTE — Telephone Encounter (Signed)
Dr Regino Schultze gave a verbal ok to hold coumadin 5 days he will send a written order by fax today.  Pt and Dr Christella Hartigan aware

## 2011-01-17 NOTE — Telephone Encounter (Signed)
Left message with receptionist to return my call regarding coumadin

## 2011-01-17 NOTE — Telephone Encounter (Signed)
Please call him back,  It really needs to be for held for 5 days for safe FNA of pancreas (if needed).

## 2011-02-13 NOTE — Progress Notes (Signed)
REVIEWED.  

## 2011-02-22 ENCOUNTER — Telehealth: Payer: Self-pay | Admitting: Gastroenterology

## 2011-02-22 NOTE — Telephone Encounter (Signed)
Pt wants to cx eus for tomorrow she says she has a cough and fever and is not sure she ever wants to have this done. I will call referring and let them know

## 2011-02-23 ENCOUNTER — Encounter (HOSPITAL_COMMUNITY): Admission: RE | Payer: Self-pay | Source: Ambulatory Visit

## 2011-02-23 ENCOUNTER — Ambulatory Visit (HOSPITAL_COMMUNITY): Admission: RE | Admit: 2011-02-23 | Payer: Medicare Other | Source: Ambulatory Visit | Admitting: Gastroenterology

## 2011-02-23 ENCOUNTER — Ambulatory Visit (HOSPITAL_COMMUNITY): Payer: Medicare Other | Admitting: Oncology

## 2011-02-23 SURGERY — UPPER ENDOSCOPIC ULTRASOUND (EUS) LINEAR
Anesthesia: Monitor Anesthesia Care

## 2011-02-23 NOTE — Progress Notes (Signed)
OK, pt needs next OV here w/ RMR only. Please arrange. Thanks ===View-only below this line===  ----- Message -----    From: Sula Soda    Sent: 02/23/2011   8:15 AM      To: Lorenza Burton, NP    ----- Message -----    From: Chales Abrahams, CMA    Sent: 02/22/2011   4:04 PM      To: Sula Soda  Hi just wanted to let you know that the pt cx her procedure and said she was unsure if she would ever want to have it done.  Thanks  [employer]

## 2011-03-03 NOTE — Progress Notes (Signed)
RMR next available will be in Feb 2013. Reminder in epic to contact pt at that time to set up OV with RMR only

## 2011-03-29 ENCOUNTER — Other Ambulatory Visit: Payer: Self-pay

## 2011-03-29 MED ORDER — DICYCLOMINE HCL 10 MG PO CAPS: 10.0000 mg | ORAL_CAPSULE | Freq: Every day | ORAL | Status: DC

## 2011-04-24 ENCOUNTER — Encounter: Payer: Self-pay | Admitting: Internal Medicine

## 2011-05-16 ENCOUNTER — Encounter: Payer: Self-pay | Admitting: Internal Medicine

## 2011-05-16 ENCOUNTER — Ambulatory Visit (INDEPENDENT_AMBULATORY_CARE_PROVIDER_SITE_OTHER): Payer: Medicare Other | Admitting: Internal Medicine

## 2011-05-16 DIAGNOSIS — R1310 Dysphagia, unspecified: Secondary | ICD-10-CM

## 2011-05-16 NOTE — Patient Instructions (Signed)
Take protonix 40 mg once daily for acid reflux. We will call in a prescription with one year's refills to [facility]. They will deliver your prescription at your request.  Unless you have a recurrent interim problem, we'll plan to see back in one year.  As discussed we will not reschedule the endoscopic ultrasound, upper endoscopy or colonoscopy.

## 2011-05-16 NOTE — Assessment & Plan Note (Signed)
The patient is doing well these days. Dysphagia is not a big drop him for her at this time. She needs to get back on her proton pump inhibitor.  Recent bout of idiopathic pancreatitis. I told her it is possible she may have a tiny tumor in her pancreas which could be picked up on endoscopic ultrasound. She reiterated that  she is 78 years old and does not wish any further studies done at this time. She understands and also a delay  diagnosis could negatively impact her health. She's not interested in having further evaluation of her dysphagia for the same reason.  Recommendations: Get back on Protonix 40 mg orally daily. She should take his medication indefinitely. I feel the benefits outweigh the risks.  Unless something comes up, we'll plan to see this nice lady back in one year.

## 2011-05-16 NOTE — Progress Notes (Signed)
Primary Care Physician:  Kirk Ruths, MD, MD Primary Gastroenterologist:  Dr.   Pre-Procedure History & Physical: HPI:  Sara Cohen is a 78 y.o. female here for a bout of idiopathic pancreatitis last year GERD esophageal dysphagia. She declined to have the endoscopic ultrasound. She had a bout of mild pancreatitis. Stable dilated biliary tree status post cholecystectomy. She states she is 78 years old does not want further evaluation. Also only has dysphagia she tries he couldn't breath. She is managing fairly well and does not want any further invasive studies at this time. Currently not on Protonix. She reports  the  drug store did not refill her prescription recently.  I note her weight is up 2 pounds since her last visit.  Past Medical History  Diagnosis Date  . S/P lumpectomy of breast 5-6 yrs ago    right breast ca  . Breast cancer, left breast diagnosed 2 yrs ago    no intervention  . Thyroid goiter   . GERD (gastroesophageal reflux disease)   . DM (diabetes mellitus)   . Hyperlipidemia   . Emphysema   . Cancer     lt breast ca/dx 2010/lumpectomy  . Macular degeneration, left eye   . Invasive lobular carcinoma of breast, stage 1 11/24/2010  . 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 COPD (chronic obstructive pulmonary disease) 11/24/2010  . S/P colonoscopy 10/2001    INCOMPLETE; limited to flex sig, 1 polyp removed  . S/P endoscopy 10/2001    erosive reflux esophagitis  . S/P endoscopy 12/2003    Dr Jena Gauss, erosive reflux esophagitis  . S/P colonoscopy 12/2003    non-compliant left colon, diverticulosis  . Schatzki's ring   . IBS (irritable bowel syndrome)   . Atrial fibrillation     on coumadin (Dr [facility])  . Pancreatitis 12/2010  . Hiatal hernia     Past Surgical History  Procedure Date  . Breast lumpectomy     right breast  . Cholecystectomy   . Appendectomy   . Tubal ligation   . S/p hysterectomy   . Colonoscopy 01/11/2004    normal  rectum, sigmoid diverticula, incomplete  . Esophagogastroduodenoscopy 01/11/2004    erosive reflux esophagitis, schatzki's ring, hiatal hernia  . Colonoscopy 11/04/2001    limited to flex sig, multiple diverticula  . Esophagogastroduodenoscopy 11/04/2001    erosive reflux esophagitis, hiatal hernia    Prior to Admission medications   Medication Sig Start Date End Date Taking? Authorizing Provider  ALPRAZolam Prudy Feeler) 0.5 MG tablet Take 0.5 mg by mouth at bedtime as needed. For anxiety   Yes Historical Provider, MD  amLODipine (NORVASC) 5 MG tablet Take 5 mg by mouth daily.     Yes Historical Provider, MD  dicyclomine (BENTYL) 10 MG capsule Take 1 capsule (10 mg total) by mouth daily. 03/29/11  Yes Gerrit Halls, NP  gemfibrozil (LOPID) 600 MG tablet Take 600 mg by mouth 2 (two) times daily before a meal.     Yes Historical Provider, MD  glipiZIDE (GLUCOTROL) 2.5 MG 24 hr tablet Take 2.5 mg by mouth daily.     Yes Historical Provider, MD  lisinopril (PRINIVIL,ZESTRIL) 40 MG tablet Take 40 mg by mouth 2 (two) times daily.    Yes Historical Provider, MD  Multiple Vitamins-Minerals (EYE-VITE EXTRA PLUS LUTEIN) TABS Take 1 tablet by mouth daily.    Yes Historical Provider, MD  Nebulizer MISC by Does not apply route. prn    Yes Historical Provider,  MD  trimethoprim (TRIMPEX) 100 MG tablet Take 100 mg by mouth. Takes 1 tablet on Mondays, Wednesdays and Fridays    Yes Historical Provider, MD  warfarin (COUMADIN) 5 MG tablet Take 5 mg by mouth daily. Takes 5mg  every day but Fridays.  On Fridays takes 2.5mg    Yes Historical Provider, MD  AMITIZA 8 MCG capsule Take 8 mcg by mouth Twice daily.  12/09/10   Historical Provider, MD  fish oil-omega-3 fatty acids 1000 MG capsule Take 2 g by mouth daily.      Historical Provider, MD  pantoprazole (PROTONIX) 40 MG tablet Take 1 tablet (40 mg total) by mouth daily. 12/02/10 12/02/11  Lorenza Burton, NP    Allergies as of 05/16/2011 - Review Complete 05/16/2011  Allergen  Reaction Noted  . Penicillins  06/02/2010    No family history on file.  History   Social History  . Marital Status: Widowed    Spouse Name: N/A    Number of Children: 4  . Years of Education: N/A   Occupational History  . retired    Social History Main Topics  . Smoking status: Current Everyday Smoker -- 1.0 packs/day for 70 years    Types: Cigarettes  . Smokeless tobacco: Not on file  . Alcohol Use: No  . Drug Use: No  . Sexually Active: Not on file   Other Topics Concern  . Not on file   Social History Narrative   Lives w/ youngest son    Review of Systems: See HPI, otherwise negative ROS  Physical Exam: BP 137/80  Pulse 85  Temp(Src) 98.2 F (36.8 C) (Temporal)  Ht 5\' 2"  (1.575 m)  Wt 133 lb 6.4 oz (60.51 kg)  BMI 24.40 kg/m2 General:   Alert,  elderly frail pleasant and cooperative in NAD Skin:  Intact without significant lesions or rashes. Eyes:  Sclera clear, no icterus.   Conjunctiva pink. Ears:  Normal auditory acuity. Nose:  No deformity, discharge,  or lesions. Mouth:  No deformity or lesions. Neck:  Supple; no masses or thyromegaly. No significant cervical adenopathy. Lungs:  Clear throughout to auscultation.   No wheezes, crackles, or rhonchi. No acute distress. Heart:  Regular rate and rhythm; no murmurs, clicks, rubs,  or gallops. Abdomen: Non-distended, normal bowel sounds.  Soft and nontender without appreciable mass or hepatosplenomegaly.  Pulses:  Normal pulses noted. Extremities:  Without clubbing or edema.  Impression/Plan:

## 2011-10-13 ENCOUNTER — Other Ambulatory Visit: Payer: Self-pay | Admitting: Gastroenterology

## 2011-11-24 ENCOUNTER — Ambulatory Visit (HOSPITAL_COMMUNITY): Payer: Medicare Other | Admitting: Oncology

## 2012-02-06 ENCOUNTER — Encounter: Payer: Self-pay | Admitting: Internal Medicine

## 2012-02-06 NOTE — Telephone Encounter (Signed)
Pt's daughter called to let us know that the Dexilant prescription was too expensive ($200) and was there anything else less expensive she could take. Please advise and call [phone] Sallye Ober is the daughter and patient uses [facility].

## 2012-02-08 NOTE — Telephone Encounter (Signed)
Wrong patient chart/DISREGARD MESSAGE

## 2012-02-08 NOTE — Telephone Encounter (Signed)
This pt is on protonix per last encounter w/ RMR. What's the story here?

## 2012-04-09 ENCOUNTER — Other Ambulatory Visit: Payer: Self-pay | Admitting: Gastroenterology

## 2012-05-24 ENCOUNTER — Ambulatory Visit: Payer: Medicare Other | Admitting: Internal Medicine

## 2012-05-28 ENCOUNTER — Ambulatory Visit: Payer: Self-pay | Admitting: Internal Medicine

## 2012-05-28 DIAGNOSIS — Z7901 Long term (current) use of anticoagulants: Secondary | ICD-10-CM | POA: Insufficient documentation

## 2012-05-28 DIAGNOSIS — I4891 Unspecified atrial fibrillation: Secondary | ICD-10-CM

## 2012-05-28 DIAGNOSIS — I480 Paroxysmal atrial fibrillation: Secondary | ICD-10-CM | POA: Insufficient documentation

## 2012-06-12 ENCOUNTER — Ambulatory Visit (INDEPENDENT_AMBULATORY_CARE_PROVIDER_SITE_OTHER): Payer: Medicare Other | Admitting: Internal Medicine

## 2012-06-12 ENCOUNTER — Encounter: Payer: Self-pay | Admitting: Internal Medicine

## 2012-06-12 ENCOUNTER — Other Ambulatory Visit: Payer: Self-pay

## 2012-06-12 VITALS — BP 135/69 | HR 63 | Temp 98.1°F | Ht 62.0 in | Wt 133.6 lb

## 2012-06-12 DIAGNOSIS — K589 Irritable bowel syndrome without diarrhea: Secondary | ICD-10-CM

## 2012-06-12 DIAGNOSIS — K219 Gastro-esophageal reflux disease without esophagitis: Secondary | ICD-10-CM

## 2012-06-12 MED ORDER — PANTOPRAZOLE SODIUM 40 MG PO TBEC: 40.0000 mg | DELAYED_RELEASE_TABLET | Freq: Every day | ORAL | Status: DC

## 2012-06-12 NOTE — Patient Instructions (Addendum)
Take Benefiber - 2 tablespoons daily -take every day  Continue Protonix 40 mg daily  Office visit with [place] in 3 months

## 2012-06-12 NOTE — Progress Notes (Signed)
Primary Care Physician:  Kirk Ruths, MD Primary Gastroenterologist:  Dr. Jena Gauss  Pre-Procedure History & Physical: HPI:  Sara Cohen is a 79 y.o. female here for  a bout of idiopathic pancreatitis last year. Patient has a chronically dilated biliary tree. Gallbladder long since removed. Patient declined to go for an endoscopic ultrasound to further evaluate her as the cause of pancreatitis., However, patient states she was "too old" and declined to have any further studies. She returns 1 year later has intermittent postprandial abdominal cramps and sent to 3 bowel movements in the morning associated with the abdominal cramps then everything subsides and she does well the remainder of the day. She does not have any postprandial abdominal pain, nausea or vomiting. Weight has been stable at 133 pounds. Patient has not had any problems with melena or hematochezia. Previously on Amitiza for constipation. No fiber supplement. Has known diverticulosis. Typical GERD symptoms well controlled on Protonix..  Past Medical History  Diagnosis Date  . S/P lumpectomy of breast 5-6 yrs ago    right breast ca  . Breast cancer, left breast diagnosed 2 yrs ago    no intervention  . Thyroid goiter   . GERD (gastroesophageal reflux disease)   . DM (diabetes mellitus)   . Hyperlipidemia   . Emphysema   . Cancer     lt breast ca/dx 2010/lumpectomy  . Macular degeneration, left eye   . Invasive lobular carcinoma of breast, stage 1 11/24/2010  . 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 COPD (chronic obstructive pulmonary disease) 11/24/2010  . S/P colonoscopy 10/2001    INCOMPLETE; limited to flex sig, 1 polyp removed  . S/P endoscopy 10/2001    erosive reflux esophagitis  . S/P endoscopy 12/2003    Dr Jena Gauss, erosive reflux esophagitis  . S/P colonoscopy 12/2003    non-compliant left colon, diverticulosis  . Schatzki's ring   . IBS (irritable bowel syndrome)   . Atrial fibrillation     on  coumadin (Dr [facility])  . Pancreatitis 12/2010  . Hiatal hernia     Past Surgical History  Procedure Laterality Date  . Breast lumpectomy      right breast  . Cholecystectomy    . Appendectomy    . Tubal ligation    . S/p hysterectomy    . Colonoscopy  01/11/2004    RMR-normal rectum, sigmoid diverticula, incomplete  . Esophagogastroduodenoscopy  01/11/2004    RMR-erosive reflux esophagitis, schatzki's ring, hiatal hernia  . Colonoscopy  11/04/2001    NUR-limited to flex sig, multiple diverticula  . Esophagogastroduodenoscopy  11/04/2001    NUR-erosive reflux esophagitis, hiatal hernia    Prior to Admission medications   Medication Sig Start Date End Date Taking? Authorizing Provider  ALPRAZolam Prudy Feeler) 0.5 MG tablet Take 0.5 mg by mouth at bedtime as needed. For anxiety   Yes Historical Provider, MD  amLODipine (NORVASC) 5 MG tablet Take 5 mg by mouth daily.     Yes Historical Provider, MD  dicyclomine (BENTYL) 10 MG capsule TAKE 1 CAPSULE BY MOUTH DAILY. 04/09/12  Yes Joselyn Arrow, NP  gemfibrozil (LOPID) 600 MG tablet Take 600 mg by mouth 2 (two) times daily before a meal.     Yes Historical Provider, MD  glipiZIDE (GLUCOTROL) 2.5 MG 24 hr tablet Take 2.5 mg by mouth daily.     Yes Historical Provider, MD  lisinopril (PRINIVIL,ZESTRIL) 40 MG tablet Take 40 mg by mouth 2 (two) times daily.  Yes Historical Provider, MD  Multiple Vitamins-Minerals (EYE-VITE EXTRA PLUS LUTEIN) TABS Take 1 tablet by mouth daily.    Yes Historical Provider, MD  trimethoprim (TRIMPEX) 100 MG tablet Take 100 mg by mouth. Takes 1 tablet on Mondays, Wednesdays and Fridays    Yes Historical Provider, MD  warfarin (COUMADIN) 5 MG tablet Take 5 mg by mouth daily. Takes 5mg  every day but Fridays.  On Fridays takes 2.5mg    Yes Historical Provider, MD  AMITIZA 8 MCG capsule Take 8 mcg by mouth Twice daily.  12/09/10   Historical Provider, MD  fish oil-omega-3 fatty acids 1000 MG capsule Take 2 g by mouth  daily.      Historical Provider, MD  Nebulizer MISC by Does not apply route. prn     Historical Provider, MD  pantoprazole (PROTONIX) 40 MG tablet Take 1 tablet (40 mg total) by mouth daily. 06/12/12   Nira Retort, NP    Allergies as of 06/12/2012 - Review Complete 06/12/2012  Allergen Reaction Noted  . Penicillins  06/02/2010    No family history on file.  History   Social History  . Marital Status: Widowed    Spouse Name: N/A    Number of Children: 4  . Years of Education: N/A   Occupational History  . retired    Social History Main Topics  . Smoking status: Current Every Day Smoker -- 1.00 packs/day for 70 years    Types: Cigarettes  . Smokeless tobacco: Not on file  . Alcohol Use: No  . Drug Use: No  . Sexually Active: Not on file   Other Topics Concern  . Not on file   Social History Narrative   Lives w/ youngest son    Review of Systems: See HPI, otherwise negative ROS  Physical Exam: BP 135/69  Pulse 63  Temp(Src) 98.1 F (36.7 C) (Oral)  Ht 5\' 2"  (1.575 m)  Wt 133 lb 9.6 oz (60.601 kg)  BMI 24.43 kg/m2 General:   Alert,  elderly lady pleasant and cooperative in NAD Skin:  Intact without significant lesions or rashes. Eyes:  Sclera clear, no icterus.   Conjunctiva pink. Ears:  Normal auditory acuity. Nose:  No deformity, discharge,  or lesions. Mouth:  No deformity or lesions. Neck:  Supple; no masses or thyromegaly. No significant cervical adenopathy. Lungs:  Clear throughout to auscultation.   No wheezes, crackles, or rhonchi. No acute distress. Heart:  Regular rate and rhythm; no murmurs, clicks, rubs,  or gallops. Abdomen: Non-distended, normal bowel sounds.  Soft and nontender without appreciable mass or hepatosplenomegaly.  Pulses:  Normal pulses noted. Extremities:  Without clubbing or edema.  Impression/Plan:  Pleasant almost 79 year old lady with intermittent postprandial abdominal cramps and hyperdefecation in the morning. She does just  fine the remainder of the day and night. I suspect her symptoms fall into the the realm of IBS. She has not had any recurrent menacing symptoms to suggest pancreatitis. Her weight has been stable. Reflux symptoms well controlled.  Recommendations: Add a daily fiber supplement to her regimen. I've recommended Benefiber 2 tablespoons daily. I told her take this supplement daily without fail. She is to continue Protonix 40 mg daily for now.

## 2012-06-17 ENCOUNTER — Encounter (HOSPITAL_COMMUNITY): Payer: Self-pay | Admitting: Emergency Medicine

## 2012-06-17 ENCOUNTER — Emergency Department (HOSPITAL_COMMUNITY): Payer: Medicare Other

## 2012-06-17 ENCOUNTER — Inpatient Hospital Stay (HOSPITAL_COMMUNITY)
Admission: EM | Admit: 2012-06-17 | Discharge: 2012-07-03 | DRG: 515 | Disposition: A | Discharge status: Skilled Nursing Facility | Payer: Medicare Other | Attending: General Surgery | Admitting: General Surgery

## 2012-06-17 DIAGNOSIS — S32409A Unspecified fracture of unspecified acetabulum, initial encounter for closed fracture: Secondary | ICD-10-CM | POA: Diagnosis present

## 2012-06-17 DIAGNOSIS — S32509A Unspecified fracture of unspecified pubis, initial encounter for closed fracture: Principal | ICD-10-CM | POA: Diagnosis present

## 2012-06-17 DIAGNOSIS — R7989 Other specified abnormal findings of blood chemistry: Secondary | ICD-10-CM | POA: Diagnosis not present

## 2012-06-17 DIAGNOSIS — J449 Chronic obstructive pulmonary disease, unspecified: Secondary | ICD-10-CM | POA: Diagnosis present

## 2012-06-17 DIAGNOSIS — D72829 Elevated white blood cell count, unspecified: Secondary | ICD-10-CM | POA: Diagnosis not present

## 2012-06-17 DIAGNOSIS — S0100XA Unspecified open wound of scalp, initial encounter: Secondary | ICD-10-CM | POA: Diagnosis present

## 2012-06-17 DIAGNOSIS — J811 Chronic pulmonary edema: Secondary | ICD-10-CM | POA: Diagnosis not present

## 2012-06-17 DIAGNOSIS — J9819 Other pulmonary collapse: Secondary | ICD-10-CM | POA: Diagnosis not present

## 2012-06-17 DIAGNOSIS — D0510 Intraductal carcinoma in situ of unspecified breast: Secondary | ICD-10-CM | POA: Diagnosis present

## 2012-06-17 DIAGNOSIS — D739 Disease of spleen, unspecified: Secondary | ICD-10-CM | POA: Diagnosis present

## 2012-06-17 DIAGNOSIS — I491 Atrial premature depolarization: Secondary | ICD-10-CM | POA: Diagnosis not present

## 2012-06-17 DIAGNOSIS — S0101XA Laceration without foreign body of scalp, initial encounter: Secondary | ICD-10-CM | POA: Insufficient documentation

## 2012-06-17 DIAGNOSIS — I4891 Unspecified atrial fibrillation: Secondary | ICD-10-CM | POA: Diagnosis present

## 2012-06-17 DIAGNOSIS — Y92410 Unspecified street and highway as the place of occurrence of the external cause: Secondary | ICD-10-CM

## 2012-06-17 DIAGNOSIS — R0682 Tachypnea, not elsewhere classified: Secondary | ICD-10-CM | POA: Diagnosis not present

## 2012-06-17 DIAGNOSIS — R1310 Dysphagia, unspecified: Secondary | ICD-10-CM | POA: Diagnosis not present

## 2012-06-17 DIAGNOSIS — S2249XA Multiple fractures of ribs, unspecified side, initial encounter for closed fracture: Secondary | ICD-10-CM | POA: Diagnosis present

## 2012-06-17 DIAGNOSIS — K219 Gastro-esophageal reflux disease without esophagitis: Secondary | ICD-10-CM | POA: Diagnosis present

## 2012-06-17 DIAGNOSIS — E8779 Other fluid overload: Secondary | ICD-10-CM | POA: Diagnosis not present

## 2012-06-17 DIAGNOSIS — Z9089 Acquired absence of other organs: Secondary | ICD-10-CM

## 2012-06-17 DIAGNOSIS — S32591A Other specified fracture of right pubis, initial encounter for closed fracture: Secondary | ICD-10-CM | POA: Insufficient documentation

## 2012-06-17 DIAGNOSIS — S0003XA Contusion of scalp, initial encounter: Secondary | ICD-10-CM | POA: Diagnosis present

## 2012-06-17 DIAGNOSIS — R4182 Altered mental status, unspecified: Secondary | ICD-10-CM | POA: Diagnosis not present

## 2012-06-17 DIAGNOSIS — S2241XA Multiple fractures of ribs, right side, initial encounter for closed fracture: Secondary | ICD-10-CM

## 2012-06-17 DIAGNOSIS — S322XXA Fracture of coccyx, initial encounter for closed fracture: Secondary | ICD-10-CM | POA: Diagnosis present

## 2012-06-17 DIAGNOSIS — I441 Atrioventricular block, second degree: Secondary | ICD-10-CM | POA: Diagnosis not present

## 2012-06-17 DIAGNOSIS — S02620A Fracture of subcondylar process of mandible, unspecified side, initial encounter for closed fracture: Secondary | ICD-10-CM | POA: Diagnosis present

## 2012-06-17 DIAGNOSIS — Z515 Encounter for palliative care: Secondary | ICD-10-CM

## 2012-06-17 DIAGNOSIS — E872 Acidosis, unspecified: Secondary | ICD-10-CM | POA: Diagnosis not present

## 2012-06-17 DIAGNOSIS — IMO0002 Reserved for concepts with insufficient information to code with codable children: Secondary | ICD-10-CM | POA: Diagnosis not present

## 2012-06-17 DIAGNOSIS — C50919 Malignant neoplasm of unspecified site of unspecified female breast: Secondary | ICD-10-CM | POA: Diagnosis present

## 2012-06-17 DIAGNOSIS — D0590 Unspecified type of carcinoma in situ of unspecified breast: Secondary | ICD-10-CM | POA: Diagnosis present

## 2012-06-17 DIAGNOSIS — H3530 Unspecified macular degeneration: Secondary | ICD-10-CM | POA: Diagnosis present

## 2012-06-17 DIAGNOSIS — I509 Heart failure, unspecified: Secondary | ICD-10-CM | POA: Diagnosis not present

## 2012-06-17 DIAGNOSIS — E1165 Type 2 diabetes mellitus with hyperglycemia: Secondary | ICD-10-CM | POA: Diagnosis present

## 2012-06-17 DIAGNOSIS — J438 Other emphysema: Secondary | ICD-10-CM | POA: Diagnosis present

## 2012-06-17 DIAGNOSIS — Z7901 Long term (current) use of anticoagulants: Secondary | ICD-10-CM

## 2012-06-17 DIAGNOSIS — I480 Paroxysmal atrial fibrillation: Secondary | ICD-10-CM | POA: Diagnosis present

## 2012-06-17 DIAGNOSIS — I4901 Ventricular fibrillation: Secondary | ICD-10-CM | POA: Diagnosis not present

## 2012-06-17 DIAGNOSIS — E785 Hyperlipidemia, unspecified: Secondary | ICD-10-CM | POA: Diagnosis present

## 2012-06-17 DIAGNOSIS — I959 Hypotension, unspecified: Secondary | ICD-10-CM | POA: Diagnosis not present

## 2012-06-17 DIAGNOSIS — R4181 Age-related cognitive decline: Secondary | ICD-10-CM | POA: Diagnosis not present

## 2012-06-17 DIAGNOSIS — K560 Paralytic ileus: Secondary | ICD-10-CM | POA: Diagnosis not present

## 2012-06-17 DIAGNOSIS — D638 Anemia in other chronic diseases classified elsewhere: Secondary | ICD-10-CM | POA: Diagnosis not present

## 2012-06-17 DIAGNOSIS — K589 Irritable bowel syndrome without diarrhea: Secondary | ICD-10-CM | POA: Diagnosis present

## 2012-06-17 DIAGNOSIS — K449 Diaphragmatic hernia without obstruction or gangrene: Secondary | ICD-10-CM | POA: Diagnosis present

## 2012-06-17 DIAGNOSIS — J90 Pleural effusion, not elsewhere classified: Secondary | ICD-10-CM | POA: Diagnosis not present

## 2012-06-17 DIAGNOSIS — M810 Age-related osteoporosis without current pathological fracture: Secondary | ICD-10-CM | POA: Diagnosis present

## 2012-06-17 DIAGNOSIS — E049 Nontoxic goiter, unspecified: Secondary | ICD-10-CM | POA: Diagnosis present

## 2012-06-17 DIAGNOSIS — F17200 Nicotine dependence, unspecified, uncomplicated: Secondary | ICD-10-CM | POA: Diagnosis present

## 2012-06-17 DIAGNOSIS — R34 Anuria and oliguria: Secondary | ICD-10-CM | POA: Diagnosis not present

## 2012-06-17 DIAGNOSIS — Q393 Congenital stenosis and stricture of esophagus: Secondary | ICD-10-CM

## 2012-06-17 DIAGNOSIS — E876 Hypokalemia: Secondary | ICD-10-CM | POA: Diagnosis not present

## 2012-06-17 DIAGNOSIS — I498 Other specified cardiac arrhythmias: Secondary | ICD-10-CM | POA: Diagnosis not present

## 2012-06-17 DIAGNOSIS — Q391 Atresia of esophagus with tracheo-esophageal fistula: Secondary | ICD-10-CM

## 2012-06-17 DIAGNOSIS — S3210XA Unspecified fracture of sacrum, initial encounter for closed fracture: Secondary | ICD-10-CM | POA: Insufficient documentation

## 2012-06-17 DIAGNOSIS — J9600 Acute respiratory failure, unspecified whether with hypoxia or hypercapnia: Secondary | ICD-10-CM | POA: Diagnosis present

## 2012-06-17 DIAGNOSIS — B3749 Other urogenital candidiasis: Secondary | ICD-10-CM | POA: Diagnosis not present

## 2012-06-17 DIAGNOSIS — Z9119 Patient's noncompliance with other medical treatment and regimen: Secondary | ICD-10-CM

## 2012-06-17 DIAGNOSIS — I10 Essential (primary) hypertension: Secondary | ICD-10-CM | POA: Diagnosis present

## 2012-06-17 DIAGNOSIS — Z880 Allergy status to penicillin: Secondary | ICD-10-CM

## 2012-06-17 DIAGNOSIS — D62 Acute posthemorrhagic anemia: Secondary | ICD-10-CM | POA: Diagnosis not present

## 2012-06-17 DIAGNOSIS — E870 Hyperosmolality and hypernatremia: Secondary | ICD-10-CM | POA: Diagnosis not present

## 2012-06-17 DIAGNOSIS — Z91199 Patient's noncompliance with other medical treatment and regimen due to unspecified reason: Secondary | ICD-10-CM

## 2012-06-17 DIAGNOSIS — S329XXA Fracture of unspecified parts of lumbosacral spine and pelvis, initial encounter for closed fracture: Secondary | ICD-10-CM

## 2012-06-17 LAB — CBC
HCT: 23.6 % — ABNORMAL LOW (ref 36.0–46.0)
Hemoglobin: 12.3 g/dL (ref 12.0–15.0)
MCH: 28.9 pg (ref 26.0–34.0)
MCH: 28.8 pg (ref 26.0–34.0)
MCHC: 33.5 g/dL (ref 30.0–36.0)
MCV: 86.1 fL (ref 78.0–100.0)
Platelets: 236 10*3/uL (ref 150–400)
RBC: 4.26 MIL/uL (ref 3.87–5.11)
RDW: 13.9 % (ref 11.5–15.5)
WBC: 19.5 10*3/uL — ABNORMAL HIGH (ref 4.0–10.5)

## 2012-06-17 LAB — URINE MICROSCOPIC-ADD ON

## 2012-06-17 LAB — POCT I-STAT, CHEM 8
BUN: 24 mg/dL — ABNORMAL HIGH (ref 6–23)
Chloride: 110 mEq/L (ref 96–112)
Creatinine, Ser: 0.8 mg/dL (ref 0.50–1.10)
Glucose, Bld: 130 mg/dL — ABNORMAL HIGH (ref 70–99)
Hemoglobin: 12.9 g/dL (ref 12.0–15.0)
Potassium: 3.7 mEq/L (ref 3.5–5.1)
Sodium: 143 mEq/L (ref 135–145)

## 2012-06-17 LAB — COMPREHENSIVE METABOLIC PANEL
ALT: 32 U/L (ref 0–35)
AST: 59 U/L — ABNORMAL HIGH (ref 0–37)
Alkaline Phosphatase: 101 U/L (ref 39–117)
CO2: 23 mEq/L (ref 19–32)
Calcium: 9.3 mg/dL (ref 8.4–10.5)
GFR calc Af Amer: 84 mL/min — ABNORMAL LOW (ref >90)
GFR calc non Af Amer: 73 mL/min — ABNORMAL LOW (ref >90)
Glucose, Bld: 129 mg/dL — ABNORMAL HIGH (ref 70–99)
Potassium: 3.7 mEq/L (ref 3.5–5.1)
Sodium: 141 mEq/L (ref 135–145)

## 2012-06-17 LAB — URINALYSIS, ROUTINE W REFLEX MICROSCOPIC
Bilirubin Urine: NEGATIVE
Glucose, UA: NEGATIVE mg/dL
Ketones, ur: 15 mg/dL — AB
Nitrite: NEGATIVE
Protein, ur: 100 mg/dL — AB
Specific Gravity, Urine: 1.04 — ABNORMAL HIGH (ref 1.005–1.030)
Urobilinogen, UA: 0.2 mg/dL (ref 0.0–1.0)
pH: 5 (ref 5.0–8.0)

## 2012-06-17 LAB — GLUCOSE, CAPILLARY

## 2012-06-17 LAB — POCT I-STAT TROPONIN I: Troponin i, poc: 0 ng/mL (ref 0.00–0.08)

## 2012-06-17 LAB — ABO/RH: ABO/RH(D): A POS

## 2012-06-17 LAB — PROTIME-INR: Prothrombin Time: 23.8 seconds — ABNORMAL HIGH (ref 11.6–15.2)

## 2012-06-17 MED ORDER — ALBUMIN HUMAN 5 % IV SOLN
12.5000 g | Freq: Once | INTRAVENOUS | Status: AC
  Administered 2012-06-17: 12.5 g via INTRAVENOUS

## 2012-06-17 MED ORDER — FUROSEMIDE 10 MG/ML IJ SOLN: 20.0000 mg | Freq: Once | INTRAMUSCULAR | Status: DC

## 2012-06-17 MED ORDER — VITAMIN K1 10 MG/ML IJ SOLN
10.0000 mg | Freq: Once | INTRAMUSCULAR | Status: AC
  Administered 2012-06-17: 10 mg via INTRAVENOUS
  Filled 2012-06-17: qty 1

## 2012-06-17 MED ORDER — OXYCODONE HCL 5 MG PO TABS
5.0000 mg | ORAL_TABLET | ORAL | Status: DC | PRN
  Administered 2012-06-17 – 2012-06-18 (×3): 5 mg via ORAL
  Filled 2012-06-17 (×3): qty 1

## 2012-06-17 MED ORDER — ONDANSETRON HCL 4 MG PO TABS: 4.0000 mg | ORAL_TABLET | Freq: Four times a day (QID) | ORAL | Status: DC | PRN

## 2012-06-17 MED ORDER — FENTANYL CITRATE 0.05 MG/ML IJ SOLN
50.0000 ug | Freq: Once | INTRAMUSCULAR | Status: AC
  Administered 2012-06-17: 50 ug via INTRAVENOUS
  Filled 2012-06-17: qty 2

## 2012-06-17 MED ORDER — SODIUM CHLORIDE 0.9 % IV BOLUS (SEPSIS): 250.0000 mL | Freq: Once | INTRAVENOUS | Status: DC

## 2012-06-17 MED ORDER — BIOTENE DRY MOUTH MT LIQD
15.0000 mL | Freq: Two times a day (BID) | OROMUCOSAL | Status: DC
  Administered 2012-06-17 – 2012-06-22 (×8): 15 mL via OROMUCOSAL

## 2012-06-17 MED ORDER — KCL IN DEXTROSE-NACL 20-5-0.45 MEQ/L-%-% IV SOLN
INTRAVENOUS | Status: DC
  Administered 2012-06-17 (×2): via INTRAVENOUS
  Filled 2012-06-17 (×4): qty 1000

## 2012-06-17 MED ORDER — PANTOPRAZOLE SODIUM 40 MG PO TBEC
40.0000 mg | DELAYED_RELEASE_TABLET | Freq: Every day | ORAL | Status: DC
  Administered 2012-06-19 – 2012-07-02 (×7): 40 mg via ORAL
  Filled 2012-06-17 (×5): qty 1

## 2012-06-17 MED ORDER — HYDROMORPHONE HCL PF 1 MG/ML IJ SOLN
0.5000 mg | INTRAMUSCULAR | Status: DC | PRN
  Administered 2012-06-17 – 2012-06-18 (×2): 1 mg via INTRAVENOUS
  Administered 2012-06-18 – 2012-06-19 (×4): 0.5 mg via INTRAVENOUS
  Administered 2012-06-20: 1 mg via INTRAVENOUS
  Filled 2012-06-17 (×9): qty 1

## 2012-06-17 MED ORDER — SODIUM CHLORIDE 0.9 % IV SOLN
Freq: Once | INTRAVENOUS | Status: AC
  Administered 2012-06-17: 13:00:00 via INTRAVENOUS

## 2012-06-17 MED ORDER — ONDANSETRON HCL 4 MG/2ML IJ SOLN
4.0000 mg | Freq: Four times a day (QID) | INTRAMUSCULAR | Status: DC | PRN
  Administered 2012-06-26: 4 mg via INTRAVENOUS
  Filled 2012-06-17: qty 2

## 2012-06-17 MED ORDER — FENTANYL CITRATE 0.05 MG/ML IJ SOLN
50.0000 ug | Freq: Once | INTRAMUSCULAR | Status: AC
  Administered 2012-06-17: 50 ug via INTRAVENOUS

## 2012-06-17 MED ORDER — PANTOPRAZOLE SODIUM 40 MG PO TBEC: 40.0000 mg | DELAYED_RELEASE_TABLET | Freq: Every day | ORAL | Status: DC

## 2012-06-17 MED ORDER — IPRATROPIUM BROMIDE 0.02 % IN SOLN
0.5000 mg | RESPIRATORY_TRACT | Status: DC
  Administered 2012-06-17 – 2012-06-28 (×63): 0.5 mg via RESPIRATORY_TRACT
  Filled 2012-06-17 (×64): qty 2.5

## 2012-06-17 MED ORDER — TETANUS-DIPHTH-ACELL PERTUSSIS 5-2.5-18.5 LF-MCG/0.5 IM SUSP
0.5000 mL | Freq: Once | INTRAMUSCULAR | Status: AC
  Administered 2012-06-17: 0.5 mL via INTRAMUSCULAR
  Filled 2012-06-17: qty 0.5

## 2012-06-17 MED ORDER — PANTOPRAZOLE SODIUM 40 MG IV SOLR
40.0000 mg | Freq: Every day | INTRAVENOUS | Status: DC
  Administered 2012-06-17 – 2012-06-26 (×9): 40 mg via INTRAVENOUS
  Filled 2012-06-17 (×11): qty 40

## 2012-06-17 MED ORDER — ALBUTEROL SULFATE (5 MG/ML) 0.5% IN NEBU
2.5000 mg | INHALATION_SOLUTION | RESPIRATORY_TRACT | Status: DC
  Administered 2012-06-17 – 2012-06-28 (×63): 2.5 mg via RESPIRATORY_TRACT
  Filled 2012-06-17 (×63): qty 0.5

## 2012-06-17 MED ORDER — OXYCODONE HCL 5 MG PO TABS
10.0000 mg | ORAL_TABLET | ORAL | Status: DC | PRN
  Administered 2012-06-18 – 2012-06-19 (×4): 10 mg via ORAL
  Filled 2012-06-17 (×4): qty 2

## 2012-06-17 MED ORDER — IOHEXOL 300 MG/ML  SOLN
100.0000 mL | Freq: Once | INTRAMUSCULAR | Status: AC | PRN
  Administered 2012-06-17: 80 mL via INTRAVENOUS

## 2012-06-17 MED ORDER — FENTANYL CITRATE 0.05 MG/ML IJ SOLN
INTRAMUSCULAR | Status: AC
  Administered 2012-06-17: 50 ug via INTRAVENOUS
  Filled 2012-06-17: qty 2

## 2012-06-17 MED ORDER — LISINOPRIL 40 MG PO TABS
40.0000 mg | ORAL_TABLET | Freq: Two times a day (BID) | ORAL | Status: DC
  Administered 2012-06-22 – 2012-06-24 (×5): 40 mg via ORAL
  Filled 2012-06-17 (×17): qty 1

## 2012-06-17 MED ORDER — ALBUMIN HUMAN 5 % IV SOLN
INTRAVENOUS | Status: AC
  Filled 2012-06-17: qty 250

## 2012-06-17 MED ORDER — FENTANYL CITRATE 0.05 MG/ML IJ SOLN: 50.0000 ug | Freq: Once | INTRAMUSCULAR | Status: AC

## 2012-06-17 MED ORDER — INSULIN ASPART 100 UNIT/ML ~~LOC~~ SOLN
0.0000 [IU] | Freq: Three times a day (TID) | SUBCUTANEOUS | Status: DC
  Administered 2012-06-22: 1 [IU] via SUBCUTANEOUS

## 2012-06-17 MED ORDER — ACETAMINOPHEN 325 MG PO TABS: 650.0000 mg | ORAL_TABLET | ORAL | Status: DC | PRN

## 2012-06-17 MED ORDER — INSULIN ASPART 100 UNIT/ML ~~LOC~~ SOLN: 0.0000 [IU] | Freq: Every day | SUBCUTANEOUS | Status: DC

## 2012-06-17 MED ORDER — ALPRAZOLAM 0.5 MG PO TABS: 0.5000 mg | ORAL_TABLET | Freq: Four times a day (QID) | ORAL | Status: DC | PRN
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
## 2012-06-17 NOTE — ED Notes (Signed)
Belongings sent with family.  
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
## 2012-06-17 NOTE — ED Notes (Signed)
Dr Lynnell Chad at bedside
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
## 2012-06-17 NOTE — Consult Note (Signed)
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 height.  Findings are worst at C3-4, C5-6, C6-7 and C7- T1.  At C3-4, moderate severe bilateral neural foraminal narrowing.  At C5-6, mild to moderate bilateral neural foraminal narrowing.  At C6- 7, moderate to severe left neural foraminal narrowing.  Visualized lung apices shows no acute findings.  Thyroid is heterogeneous with a peripherally calcified nodule on the right, measuring 1.8 cm, as before.  IMPRESSION:  1.  No acute fracture or subluxation. 2.  Reversal of the normal cervical lordosis with multilevel spondylosis.   Original Report Authenticated By: Leanna Battles, M.D.      Blood pressure 103/61, pulse 93, temperature 97.2 F (36.2 C), temperature source Oral, resp. rate 23, height 5\' 2"  (1.575 m), weight 61.4 kg (135 lb 5.8 oz), SpO2 92.00%.  PHYSICAL EXAM: Overall appearance:older.  Mentally sharp.  Sutured laceration transverse forehead.  Several superficial facial excoriations.  Ecchymotic swelling/ discoloration RIGHT parotid region.  Ears:  Not examined.  Nose:  Without deformity or crepitus.    OC: edentulous.  No blood.  Tender RIGHT retromolar trigone.  Neck without nodes.   Studies Reviewed: maxillofacial CT scan   Assessment/Plan Minimally displaced  ascending ramus mandible fx, including minor coronoid process fx, RIGHT.    This fracture should be well splinted by masseter and medial pterygoid muscles.  Recommend full liquid diet next 6 weeks.  No need to place dentures for now.  Recheck my office as needed 3-4 weeks.  Flo Shanks 06/17/2012, 6:26 PM

## 2012-06-17 NOTE — Procedures (Signed)
Sara Villacis, MD, MPH, FACS Pager: [phone]  

## 2012-06-17 NOTE — ED Notes (Signed)
MD at bedside with suture cart; irrigated forehead wound with saline

## 2012-06-17 NOTE — ED Notes (Signed)
Results of lactic acid called to primary nurse Leotis Shames
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
## 2012-06-17 NOTE — ED Notes (Signed)
Returned from xray

## 2012-06-17 NOTE — ED Notes (Signed)
Dr Ethelda Chick notified of normal lactic acid

## 2012-06-17 NOTE — ED Notes (Signed)
Dr Ethelda Chick notified pt still in pain; Dr Lynnell Chad verbal order for 50 mcg fentanyl

## 2012-06-17 NOTE — Procedures (Signed)
Procedure: Complex closure facial/scalp laceration 12cm  Indication: Scalp/facial laceration   Surgeon: Charma Igo, PA-C  Assist: None  Anesthesia: 8ml 1% lidocaine w/epi  EBL: 50-127ml  Complications: Persistent bleeding from right vertex of wound  Findings: Procedure and purpose of the procedure were explained to the patient who gave verbal consent. Written consent was not obtained due to the urgent nature of the repair as the wound was actively bleeding. Time out was not performed. The wound and surrounding tissues were irrigated grossly with NS and the wound explored digitally in a clean fashion. No foreign bodies were appreciated. The wound and surrounding tissues were then irrigated and washed with betadine ointment and then approximately another NS sterilely. Wound edges appeared viable. Local anesthesia was then infiltrated into the supraorbital fossae bilaterally and the superior wound edge. Anesthesia was tested and the right inferior wound edge was found to be insufficiently numbed and more local was placed in the right inferior wound edge. A 5-0 Vicryl suture was used to ligate the bleeding area at the right wound edge with good hemostasis. Attention was then directed to the rest of the wound which was closed using a running 5-0 Prolene suture. As I was finishing closing the wound at the right edge it began to bleed again. 2 additional interrupted 5-0 Prolene sutures were used to close the wound tighter at this location which stemmed the bleeding to a trickle. The running suture broke as I was tying the last knot and so may need reinforcement later on. The patient tolerated the procedure well.   Freeman Caldron, PA-C Pager: [phone] General Trauma PA Pager: [phone]
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
## 2012-06-17 NOTE — ED Notes (Signed)
Violeta Gelinas called and asked about TDAP shot for pt before going to floor; Janee Morn, MD gave verbal order for shot

## 2012-06-17 NOTE — ED Notes (Signed)
TRAUMA NOT ENDED; PT STILL LEVEL 2 TRAUMA

## 2012-06-17 NOTE — Progress Notes (Signed)
Arrived to insert PICC however pt wants to wait and discuss with family.   Primary RN made aware.  Has 2 PIVs in place.   Will check with staff and pt tomorrow.

## 2012-06-17 NOTE — ED Notes (Signed)
Saline soaked gauze applied to pts avulsion on forehead per Dr Ethelda Chick order

## 2012-06-17 NOTE — ED Notes (Signed)
Report given to floor nurse, [place], RN; Nurse has no further questions upon report given; pt being prepared for transport to floor

## 2012-06-17 NOTE — ED Notes (Signed)
RN placed put on oxygen due to low oxygen saturation

## 2012-06-17 NOTE — Progress Notes (Signed)
Pt's blood pressure is low; systolic is in the 80's and MAP is in the 50's. Notified Dr. Janee Morn; 5% albumin ordered. Will continue to monitor.

## 2012-06-18 ENCOUNTER — Inpatient Hospital Stay (HOSPITAL_COMMUNITY): Payer: Medicare Other

## 2012-06-18 ENCOUNTER — Encounter (HOSPITAL_COMMUNITY): Payer: Self-pay | Admitting: Certified Registered"

## 2012-06-18 ENCOUNTER — Encounter (HOSPITAL_COMMUNITY): Admission: EM | Disposition: A | Discharge status: Skilled Nursing Facility | Payer: Self-pay | Source: Home / Self Care

## 2012-06-18 ENCOUNTER — Inpatient Hospital Stay (HOSPITAL_COMMUNITY): Payer: Medicare Other | Admitting: Certified Registered"

## 2012-06-18 DIAGNOSIS — D62 Acute posthemorrhagic anemia: Secondary | ICD-10-CM

## 2012-06-18 DIAGNOSIS — J95821 Acute postprocedural respiratory failure: Secondary | ICD-10-CM

## 2012-06-18 HISTORY — PX: SACRO-ILIAC PINNING: SHX5050

## 2012-06-18 LAB — CBC
HCT: 22.1 % — ABNORMAL LOW (ref 36.0–46.0)
HCT: 26.6 % — ABNORMAL LOW (ref 36.0–46.0)
Hemoglobin: 7.4 g/dL — ABNORMAL LOW (ref 12.0–15.0)
Hemoglobin: 9.1 g/dL — ABNORMAL LOW (ref 12.0–15.0)
MCH: 28.7 pg (ref 26.0–34.0)
MCHC: 33.5 g/dL (ref 30.0–36.0)
MCV: 84.2 fL (ref 78.0–100.0)
RBC: 3.16 MIL/uL — ABNORMAL LOW (ref 3.87–5.11)
RDW: 14 % (ref 11.5–15.5)
RDW: 14.6 % (ref 11.5–15.5)
WBC: 9.1 10*3/uL (ref 4.0–10.5)

## 2012-06-18 LAB — PREPARE RBC (CROSSMATCH)

## 2012-06-18 LAB — BASIC METABOLIC PANEL
BUN: 29 mg/dL — ABNORMAL HIGH (ref 6–23)
Calcium: 8.5 mg/dL (ref 8.4–10.5)
Creatinine, Ser: 0.88 mg/dL (ref 0.50–1.10)
GFR calc non Af Amer: 57 mL/min — ABNORMAL LOW (ref >90)
Glucose, Bld: 145 mg/dL — ABNORMAL HIGH (ref 70–99)

## 2012-06-18 LAB — PREPARE FRESH FROZEN PLASMA: Unit division: 0

## 2012-06-18 LAB — GLUCOSE, CAPILLARY

## 2012-06-18 SURGERY — PINNING, SACROILIAC JOINT, PERCUTANEOUS
Anesthesia: General | Site: Pelvis | Laterality: Right | Wound class: Clean

## 2012-06-18 MED ORDER — KCL IN DEXTROSE-NACL 20-5-0.45 MEQ/L-%-% IV SOLN
INTRAVENOUS | Status: AC
  Administered 2012-06-18: 1000 mL
  Filled 2012-06-18: qty 1000

## 2012-06-18 MED ORDER — ONDANSETRON HCL 4 MG/2ML IJ SOLN
INTRAMUSCULAR | Status: DC | PRN
  Administered 2012-06-18: 4 mg via INTRAVENOUS

## 2012-06-18 MED ORDER — PROPOFOL 10 MG/ML IV BOLUS
INTRAVENOUS | Status: DC | PRN
  Administered 2012-06-18: 110 mg via INTRAVENOUS

## 2012-06-18 MED ORDER — SUFENTANIL CITRATE 50 MCG/ML IV SOLN
INTRAVENOUS | Status: DC | PRN
  Administered 2012-06-18: 10 ug via INTRAVENOUS

## 2012-06-18 MED ORDER — OXYCODONE HCL 5 MG/5ML PO SOLN: 5.0000 mg | Freq: Once | ORAL | Status: DC | PRN

## 2012-06-18 MED ORDER — ROCURONIUM BROMIDE 100 MG/10ML IV SOLN
INTRAVENOUS | Status: DC | PRN
  Administered 2012-06-18: 50 mg via INTRAVENOUS

## 2012-06-18 MED ORDER — CIPROFLOXACIN IN D5W 400 MG/200ML IV SOLN
400.0000 mg | Freq: Two times a day (BID) | INTRAVENOUS | Status: DC
  Administered 2012-06-18 (×2): 400 mg via INTRAVENOUS
  Filled 2012-06-18 (×4): qty 200

## 2012-06-18 MED ORDER — OXYCODONE HCL 5 MG PO TABS: 5.0000 mg | ORAL_TABLET | Freq: Once | ORAL | Status: DC | PRN

## 2012-06-18 MED ORDER — SODIUM CHLORIDE 0.9 % IJ SOLN
10.0000 mL | INTRAMUSCULAR | Status: DC | PRN
  Administered 2012-07-01: 10 mL
  Administered 2012-07-02: 20 mL
  Administered 2012-07-02 – 2012-07-03 (×2): 10 mL
  Filled 2012-06-18 (×4): qty 10
  Filled 2012-06-18 (×2): qty 20

## 2012-06-18 MED ORDER — FUROSEMIDE 10 MG/ML IJ SOLN
20.0000 mg | Freq: Once | INTRAMUSCULAR | Status: AC
  Administered 2012-06-18: 20 mg via INTRAVENOUS
  Filled 2012-06-18: qty 2

## 2012-06-18 MED ORDER — LIDOCAINE HCL (CARDIAC) 20 MG/ML IV SOLN
INTRAVENOUS | Status: DC | PRN
  Administered 2012-06-18: 40 mg via INTRAVENOUS

## 2012-06-18 MED ORDER — VANCOMYCIN HCL IN DEXTROSE 1-5 GM/200ML-% IV SOLN
1000.0000 mg | Freq: Once | INTRAVENOUS | Status: AC
  Administered 2012-06-18: 1000 mg via INTRAVENOUS
  Filled 2012-06-18: qty 200

## 2012-06-18 MED ORDER — ALBUMIN HUMAN 5 % IV SOLN
INTRAVENOUS | Status: DC | PRN
  Administered 2012-06-18: 13:00:00 via INTRAVENOUS

## 2012-06-18 MED ORDER — PHENYLEPHRINE HCL 10 MG/ML IJ SOLN
INTRAMUSCULAR | Status: DC | PRN
  Administered 2012-06-18 (×6): 80 ug via INTRAVENOUS

## 2012-06-18 MED ORDER — TRAMADOL HCL 50 MG PO TABS
50.0000 mg | ORAL_TABLET | Freq: Four times a day (QID) | ORAL | Status: DC | PRN
  Filled 2012-06-18: qty 1

## 2012-06-18 MED ORDER — LACTATED RINGERS IV SOLN
INTRAVENOUS | Status: DC | PRN
  Administered 2012-06-18: 12:00:00 via INTRAVENOUS

## 2012-06-18 MED ORDER — HYDROMORPHONE HCL PF 1 MG/ML IJ SOLN: 0.2500 mg | INTRAMUSCULAR | Status: DC | PRN

## 2012-06-18 MED ORDER — ALBUTEROL SULFATE (5 MG/ML) 0.5% IN NEBU
INHALATION_SOLUTION | RESPIRATORY_TRACT | Status: AC
  Administered 2012-06-18: 2.5 mg
  Filled 2012-06-18: qty 0.5

## 2012-06-18 MED ORDER — 0.9 % SODIUM CHLORIDE (POUR BTL) OPTIME
TOPICAL | Status: DC | PRN
  Administered 2012-06-18: 1000 mL

## 2012-06-18 MED ORDER — SODIUM CHLORIDE 0.9 % IJ SOLN
10.0000 mL | Freq: Two times a day (BID) | INTRAMUSCULAR | Status: DC
  Administered 2012-06-18 – 2012-07-02 (×21): 10 mL
  Filled 2012-06-18: qty 10
  Filled 2012-06-18: qty 20

## 2012-06-18 MED ORDER — PROMETHAZINE HCL 25 MG/ML IJ SOLN: 6.2500 mg | INTRAMUSCULAR | Status: DC | PRN

## 2012-06-18 SURGICAL SUPPLY — 41 items
BIT DRILL CANN LRG QC 5X300 (BIT) ×1 IMPLANT
BLADE SURG 15 STRL LF DISP TIS (BLADE) ×1 IMPLANT
BLADE SURG 15 STRL SS (BLADE) ×2
BRUSH SCRUB DISP (MISCELLANEOUS) ×4 IMPLANT
CLOTH BEACON ORANGE TIMEOUT ST (SAFETY) ×2 IMPLANT
COVER SURGICAL LIGHT HANDLE (MISCELLANEOUS) ×4 IMPLANT
DRAPE C-ARM 42X72 X-RAY (DRAPES) IMPLANT
DRAPE C-ARMOR (DRAPES) ×2 IMPLANT
DRAPE INCISE IOBAN 66X45 STRL (DRAPES) ×2 IMPLANT
DRAPE LAPAROTOMY TRNSV 102X78 (DRAPE) ×2 IMPLANT
DRAPE U-SHAPE 47X51 STRL (DRAPES) ×2 IMPLANT
DRSG MEPILEX BORDER 4X4 (GAUZE/BANDAGES/DRESSINGS) ×1 IMPLANT
DRSG PAD ABDOMINAL 8X10 ST (GAUZE/BANDAGES/DRESSINGS) ×4 IMPLANT
ELECT REM PT RETURN 9FT ADLT (ELECTROSURGICAL) ×2
ELECTRODE REM PT RTRN 9FT ADLT (ELECTROSURGICAL) ×1 IMPLANT
GAUZE XEROFORM 5X9 LF (GAUZE/BANDAGES/DRESSINGS) ×2 IMPLANT
GLOVE BIO SURGEON STRL SZ7.5 (GLOVE) ×2 IMPLANT
GLOVE BIO SURGEON STRL SZ8 (GLOVE) ×2 IMPLANT
GLOVE BIOGEL PI IND STRL 7.5 (GLOVE) ×1 IMPLANT
GLOVE BIOGEL PI IND STRL 8 (GLOVE) ×1 IMPLANT
GLOVE BIOGEL PI INDICATOR 7.5 (GLOVE) ×1
GLOVE BIOGEL PI INDICATOR 8 (GLOVE) ×1
GOWN PREVENTION PLUS XLARGE (GOWN DISPOSABLE) ×2 IMPLANT
GOWN STRL NON-REIN LRG LVL3 (GOWN DISPOSABLE) ×4 IMPLANT
GUIDEWIRE THREADED 2.8 (WIRE) ×1 IMPLANT
KIT BASIN OR (CUSTOM PROCEDURE TRAY) ×2 IMPLANT
KIT ROOM TURNOVER OR (KITS) ×2 IMPLANT
MANIFOLD NEPTUNE II (INSTRUMENTS) ×2 IMPLANT
NS IRRIG 1000ML POUR BTL (IV SOLUTION) ×2 IMPLANT
PACK GENERAL/GYN (CUSTOM PROCEDURE TRAY) ×2 IMPLANT
PAD ARMBOARD 7.5X6 YLW CONV (MISCELLANEOUS) ×4 IMPLANT
SCREW CANN 32 THRD/90 7.3 (Screw) ×1 IMPLANT
SPONGE GAUZE 4X4 12PLY (GAUZE/BANDAGES/DRESSINGS) ×2 IMPLANT
STAPLER VISISTAT 35W (STAPLE) ×2 IMPLANT
SUT ETHILON 3 0 PS 1 (SUTURE) ×2 IMPLANT
SUT VIC AB 2-0 FS1 27 (SUTURE) ×2 IMPLANT
TOWEL OR 17X24 6PK STRL BLUE (TOWEL DISPOSABLE) ×2 IMPLANT
TOWEL OR 17X26 10 PK STRL BLUE (TOWEL DISPOSABLE) ×4 IMPLANT
UNDERPAD 30X30 INCONTINENT (UNDERPADS AND DIAPERS) ×2 IMPLANT
WASHER FOR 5.0 SCREWS (Washer) ×1 IMPLANT
WATER STERILE IRR 1000ML POUR (IV SOLUTION) ×2 IMPLANT

## 2012-06-18 NOTE — Brief Op Note (Signed)
06/17/2012 - 06/18/2012  1:49 PM  PATIENT:  Sara Cohen  79 y.o. female  PRE-OPERATIVE DIAGNOSIS:  Unstable pelvic fracture, LC 2  POST-OPERATIVE DIAGNOSIS:  Unstable pelvic fracture, LC 2  PROCEDURE:  Procedure(s): SACRO-ILIAC PINNING (Right)  SURGEON:  Surgeon(s) and Role:    * Budd Palmer, MD - Primary  PHYSICIAN ASSISTANT: Montez Morita, [facility]  ANESTHESIA:   general  EBL:  Total I/O In: 912.5 [I.V.:200; Blood:312.5; IV Piggyback:400] Out: 650 [Urine:650]  DRAINS: none   LOCAL MEDICATIONS USED:  NONE  SPECIMEN:  No Specimen  DISPOSITION OF SPECIMEN:  N/A  COUNTS:  YES  TOURNIQUET:  * No tourniquets in log *  DICTATION: .Other Dictation: Dictation Number [id number]  PLAN OF CARE: Admit to inpatient   PATIENT DISPOSITION:  PACU - hemodynamically stable.   Delay start of Pharmacological VTE agent (>24hrs) due to surgical blood loss or risk of bleeding: no

## 2012-06-18 NOTE — Transfer of Care (Signed)
Immediate Anesthesia Transfer of Care Note  Patient: Sara Cohen  Procedure(s) Performed: Procedure(s): SACRO-ILIAC PINNING (Right)  Patient Location: PACU  Anesthesia Type:General  Level of Consciousness: awake  Airway & Oxygen Therapy: Patient Spontanous Breathing and Patient connected to nasal cannula oxygen  Post-op Assessment: Report given to PACU RN, Post -op Vital signs reviewed and stable and Patient moving all extremities  Post vital signs: Reviewed and stable  Complications: No apparent anesthesia complications

## 2012-06-18 NOTE — Progress Notes (Signed)
Peripherally Inserted Central Catheter/Midline Placement  The IV Nurse has discussed with the patient and/or persons authorized to consent for the patient, the purpose of this procedure and the potential benefits and risks involved with this procedure.  The benefits include less needle sticks, lab draws from the catheter and patient may be discharged home with the catheter.  Risks include, but not limited to, infection, bleeding, blood clot (thrombus formation), and puncture of an artery; nerve damage and irregular heat beat.  Alternatives to this procedure were also discussed.  PICC/Midline Placement Documentation     patient gave verbal consent   Sara Cohen 06/18/2012, 9:59 AM

## 2012-06-18 NOTE — Anesthesia Postprocedure Evaluation (Signed)
Anesthesia Post Note  Patient: Sara Cohen  Procedure(s) Performed: Procedure(s) (LRB): SACRO-ILIAC PINNING (Right)  Anesthesia type: general  Patient location: PACU  Post pain: Pain level controlled  Post assessment: Patient's Cardiovascular Status Stable  Last Vitals:  Filed Vitals:   06/18/12 1515  BP: 127/104  Pulse: 104  Temp:   Resp: 22    Post vital signs: Reviewed and stable  Level of consciousness: sedated  Complications: No apparent anesthesia complications
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
## 2012-06-18 NOTE — Anesthesia Procedure Notes (Signed)
Procedure Name: Intubation Date/Time: 06/18/2012 12:31 PM Performed by: Charm Barges, Kole Hilyard R Pre-anesthesia Checklist: Patient identified, Emergency Drugs available, Suction available, Patient being monitored and Timeout performed Patient Re-evaluated:Patient Re-evaluated prior to inductionOxygen Delivery Method: Circle system utilized Preoxygenation: Pre-oxygenation with 100% oxygen Intubation Type: IV induction Ventilation: Mask ventilation without difficulty Laryngoscope Size: Mac and 3 Grade View: Grade III Tube size: 8.0 mm Number of attempts: 1 Airway Equipment and Method: Stylet Placement Confirmation: ETT inserted through vocal cords under direct vision,  positive ETCO2 and breath sounds checked- equal and bilateral Secured at: 20 cm Tube secured with: Tape Dental Injury: Teeth and Oropharynx as per pre-operative assessment
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
## 2012-06-18 NOTE — Progress Notes (Signed)
Called Dr. Lindie Spruce in reference to her decreased bp of 89/42.  He ordered a 250 bolus of NS but was not overly concerned about her decreased bp.  Patient was alert and oriented. Sara Cohen

## 2012-06-18 NOTE — Anesthesia Preprocedure Evaluation (Addendum)
Anesthesia Evaluation    Reviewed: Allergy & Precautions, H&P , NPO status , Patient's Chart, lab work & pertinent test results  Airway       Dental   Pulmonary COPD         Cardiovascular + dysrhythmias     Neuro/Psych negative neurological ROS     GI/Hepatic hiatal hernia, GERD-  Medicated,  Endo/Other  diabetes, Oral Hypoglycemic Agents  Renal/GU negative Renal ROS     Musculoskeletal   Abdominal   Peds  Hematology   Anesthesia Other Findings   Reproductive/Obstetrics                           Anesthesia Physical Anesthesia Plan  ASA: III  Anesthesia Plan: General   Post-op Pain Management:    Induction: Intravenous  Airway Management Planned: Oral ETT  Additional Equipment:   Intra-op Plan:   Post-operative Plan: Extubation in OR  Informed Consent:   Plan Discussed with: CRNA, Anesthesiologist and Surgeon  Anesthesia Plan Comments:         Anesthesia Quick Evaluation
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
## 2012-06-19 ENCOUNTER — Inpatient Hospital Stay (HOSPITAL_COMMUNITY): Payer: Medicare Other

## 2012-06-19 ENCOUNTER — Encounter (HOSPITAL_COMMUNITY): Payer: Self-pay | Admitting: Orthopedic Surgery

## 2012-06-19 DIAGNOSIS — B3741 Candidal cystitis and urethritis: Secondary | ICD-10-CM | POA: Insufficient documentation

## 2012-06-19 LAB — BLOOD GAS, ARTERIAL
Acid-base deficit: 5.2 mmol/L — ABNORMAL HIGH (ref 0.0–2.0)
Bicarbonate: 21 mEq/L (ref 20.0–24.0)
Drawn by: 283401
FIO2: 0.5 %
TCO2: 22.6 mmol/L (ref 0–100)
pCO2 arterial: 50.9 mmHg — ABNORMAL HIGH (ref 35.0–45.0)
pO2, Arterial: 63.5 mmHg — ABNORMAL LOW (ref 80.0–100.0)

## 2012-06-19 LAB — BASIC METABOLIC PANEL
BUN: 32 mg/dL — ABNORMAL HIGH (ref 6–23)
Chloride: 101 mEq/L (ref 96–112)
Creatinine, Ser: 1.07 mg/dL (ref 0.50–1.10)
Glucose, Bld: 386 mg/dL — ABNORMAL HIGH (ref 70–99)
Potassium: 5.4 mEq/L — ABNORMAL HIGH (ref 3.5–5.1)

## 2012-06-19 LAB — GLUCOSE, CAPILLARY
Glucose-Capillary: 133 mg/dL — ABNORMAL HIGH (ref 70–99)
Glucose-Capillary: 112 mg/dL — ABNORMAL HIGH (ref 70–99)

## 2012-06-19 LAB — CBC
HCT: 21.8 % — ABNORMAL LOW (ref 36.0–46.0)
Hemoglobin: 9 g/dL — ABNORMAL LOW (ref 12.0–15.0)
Hemoglobin: 7.6 g/dL — ABNORMAL LOW (ref 12.0–15.0)
MCH: 29.6 pg (ref 26.0–34.0)
Platelets: 123 10*3/uL — ABNORMAL LOW (ref 150–400)
RBC: 3.04 MIL/uL — ABNORMAL LOW (ref 3.87–5.11)
RBC: 2.58 MIL/uL — ABNORMAL LOW (ref 3.87–5.11)
WBC: 8.1 10*3/uL (ref 4.0–10.5)
WBC: 8.1 10*3/uL (ref 4.0–10.5)

## 2012-06-19 LAB — PROTIME-INR
INR: 1.3 (ref 0.00–1.49)
Prothrombin Time: 15.9 seconds — ABNORMAL HIGH (ref 11.6–15.2)

## 2012-06-19 LAB — URINE CULTURE: Colony Count: 50000

## 2012-06-19 LAB — PREPARE RBC (CROSSMATCH)

## 2012-06-19 MED ORDER — SODIUM CHLORIDE 0.9 % IV SOLN
500.0000 mL | Freq: Once | INTRAVENOUS | Status: AC
  Administered 2012-06-19: 250 mL via INTRAVENOUS

## 2012-06-19 MED ORDER — FLUCONAZOLE 100 MG PO TABS
100.0000 mg | ORAL_TABLET | Freq: Every day | ORAL | Status: DC
  Administered 2012-06-19: 100 mg via ORAL
  Filled 2012-06-19 (×2): qty 1

## 2012-06-19 MED ORDER — FUROSEMIDE 10 MG/ML IJ SOLN
20.0000 mg | Freq: Once | INTRAMUSCULAR | Status: AC
  Administered 2012-06-19: 20 mg via INTRAVENOUS
  Filled 2012-06-19: qty 2

## 2012-06-19 MED ORDER — ALBUMIN HUMAN 5 % IV SOLN
25.0000 g | Freq: Once | INTRAVENOUS | Status: AC
  Administered 2012-06-19: 25 g via INTRAVENOUS
  Filled 2012-06-19: qty 500

## 2012-06-19 MED ORDER — SODIUM CHLORIDE 0.9 % IV SOLN
INTRAVENOUS | Status: DC
  Administered 2012-06-19 – 2012-06-25 (×6): via INTRAVENOUS

## 2012-06-19 MED ORDER — CYCLOBENZAPRINE HCL 5 MG PO TABS
5.0000 mg | ORAL_TABLET | Freq: Three times a day (TID) | ORAL | Status: DC
  Administered 2012-06-19 (×4): 5 mg via ORAL
  Filled 2012-06-19 (×7): qty 1

## 2012-06-19 MED ORDER — ACETAMINOPHEN 10 MG/ML IV SOLN
1000.0000 mg | Freq: Four times a day (QID) | INTRAVENOUS | Status: AC
  Administered 2012-06-19 (×4): 1000 mg via INTRAVENOUS
  Filled 2012-06-19 (×4): qty 100

## 2012-06-19 MED ORDER — FUROSEMIDE 10 MG/ML IJ SOLN
10.0000 mg | Freq: Once | INTRAMUSCULAR | Status: AC
  Administered 2012-06-19: 10 mg via INTRAVENOUS
  Filled 2012-06-19: qty 1

## 2012-06-19 MED ORDER — ALBUMIN HUMAN 5 % IV SOLN
12.5000 g | Freq: Once | INTRAVENOUS | Status: AC
  Administered 2012-06-19: 12.5 g via INTRAVENOUS
  Filled 2012-06-19: qty 250
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
## 2012-06-19 NOTE — Progress Notes (Signed)
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     Anti-infectives: Anti-infectives   Start     Dose/Rate Route Frequency Ordered Stop   06/18/12 1100  [MAR Hold]  vancomycin (VANCOCIN) IVPB 1000 mg/200 mL premix     (On MAR Hold since 06/18/12 1139)   1,000 mg 200 mL/hr over 60 Minutes Intravenous  Once 06/18/12 0855 06/18/12 1154   06/18/12 1000  ciprofloxacin (CIPRO) IVPB 400 mg    Comments:  For UTI noted on admission   400 mg 200 mL/hr over 60 Minutes Intravenous Every 12 hours 06/18/12 0855        Assessment/Plan: s/p Procedure(s): SACRO-ILIAC PINNING Advance diet Continue foley due to strict I&O Start PT/OT Measure CVP and likely to give volume for low BP.  BUN and creatinine are increased after getting lasix yesterday and this AM.  Urine output did not pick up more either.   LOS: 2 days   Marta Lamas. Gae Bon, MD, FACS [phone] Trauma Surgeon 06/19/2012

## 2012-06-19 NOTE — Progress Notes (Signed)
PRVC scanned at 1042; verified by Jarome Lamas, RN. Documentation is under previous FFP transfusion. Unable to correct documentation to put under PRVC transfusion. Patient tolerated blood transfusion well.Will continue to monitor.
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
## 2012-06-19 NOTE — Progress Notes (Signed)
Pt received breathing treatment at 1916; O2 sats and work of breathing improved. Pt stated that she "felt better" and when asked if she could breath easier she stated "yes". Pt's overall general appearance improved greatly. Pt continues to be of Venturi mask 50% (15L/min). Will continue to monitor.

## 2012-06-19 NOTE — Progress Notes (Signed)
RN and Charge RT aware that CVP has been connected to PICC red port- RN moved existing line from red port to purple port.

## 2012-06-19 NOTE — Progress Notes (Signed)
Hmg 7.6; decreased from 9.0 at 0119. Notified Dr. Lindie Spruce; orders to transfuse 2 units of RBC and to collect a occult blood stool sample. Will continue to monitor.

## 2012-06-19 NOTE — Progress Notes (Signed)
Called Dr. Andrey Campanile regarding patient's declining bp over the last hour.  Also notified of current BMET with K+ of 5.4.  Ordered 5% Albumin, .  Will let AM MD address K+. Shareena Nusz C

## 2012-06-19 NOTE — Progress Notes (Signed)
Patient ID: Sara Cohen, female   DOB: 1921/02/17, 79 y.o.   MRN: [id number]  Patient was having decreased sats and BP.  Improved with breathing treatment.  CXR shows increasing right pleural effusion.  She denies SOB Will check an ABG and transfuse 2 units PRBC.
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
## 2012-06-19 NOTE — Progress Notes (Signed)
I saw and examined the patient with Mr. Renae Fickle, communicating the findings and plan noted above. Pain much improved post screw placement.
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
## 2012-06-20 ENCOUNTER — Inpatient Hospital Stay (HOSPITAL_COMMUNITY): Payer: Medicare Other

## 2012-06-20 ENCOUNTER — Inpatient Hospital Stay (HOSPITAL_COMMUNITY): Payer: Medicare Other | Admitting: Certified Registered Nurse Anesthetist

## 2012-06-20 ENCOUNTER — Encounter (HOSPITAL_COMMUNITY): Payer: Self-pay | Admitting: Certified Registered Nurse Anesthetist

## 2012-06-20 DIAGNOSIS — E1165 Type 2 diabetes mellitus with hyperglycemia: Secondary | ICD-10-CM | POA: Diagnosis present

## 2012-06-20 LAB — CBC
HCT: 35.8 % — ABNORMAL LOW (ref 36.0–46.0)
HCT: 35.5 % — ABNORMAL LOW (ref 36.0–46.0)
Hemoglobin: 12.4 g/dL (ref 12.0–15.0)
MCH: 29.7 pg (ref 26.0–34.0)
MCHC: 35.8 g/dL (ref 30.0–36.0)
MCHC: 34.9 g/dL (ref 30.0–36.0)
MCV: 82.9 fL (ref 78.0–100.0)
RBC: 4.28 MIL/uL (ref 3.87–5.11)
RBC: 4.28 MIL/uL (ref 3.87–5.11)
RDW: 15.5 % (ref 11.5–15.5)
RDW: 15.9 % — ABNORMAL HIGH (ref 11.5–15.5)
RDW: 15.6 % — ABNORMAL HIGH (ref 11.5–15.5)
WBC: 8.7 10*3/uL (ref 4.0–10.5)
WBC: 10.5 10*3/uL (ref 4.0–10.5)

## 2012-06-20 LAB — CBC WITH DIFFERENTIAL/PLATELET
Basophils Relative: 0 % (ref 0–1)
Eosinophils Absolute: 0.1 10*3/uL (ref 0.0–0.7)
Eosinophils Relative: 1 % (ref 0–5)
HCT: 36.4 % (ref 36.0–46.0)
Hemoglobin: 12.7 g/dL (ref 12.0–15.0)
MCH: 29.1 pg (ref 26.0–34.0)
MCHC: 34.9 g/dL (ref 30.0–36.0)
Monocytes Absolute: 1.3 10*3/uL — ABNORMAL HIGH (ref 0.1–1.0)
Monocytes Relative: 14 % — ABNORMAL HIGH (ref 3–12)
RDW: 15.6 % — ABNORMAL HIGH (ref 11.5–15.5)

## 2012-06-20 LAB — TYPE AND SCREEN
Unit division: 0
Unit division: 0
Unit division: 0
Unit division: 0

## 2012-06-20 LAB — BLOOD GAS, ARTERIAL
Acid-base deficit: 5.9 mmol/L — ABNORMAL HIGH (ref 0.0–2.0)
Bicarbonate: 20.9 mEq/L (ref 20.0–24.0)
Bicarbonate: 35.5 mEq/L — ABNORMAL HIGH (ref 20.0–24.0)
FIO2: 100 %
O2 Saturation: 98.6 %
PEEP: 5 cmH2O
Patient temperature: 98.6
RATE: 14 resp/min
TCO2: 22.7 mmol/L (ref 0–100)
pCO2 arterial: 51.1 mmHg — ABNORMAL HIGH (ref 35.0–45.0)
pH, Arterial: 7.456 — ABNORMAL HIGH (ref 7.350–7.450)
pO2, Arterial: 108 mmHg — ABNORMAL HIGH (ref 80.0–100.0)

## 2012-06-20 LAB — GLUCOSE, CAPILLARY
Glucose-Capillary: 90 mg/dL (ref 70–99)
Glucose-Capillary: 71 mg/dL (ref 70–99)
Glucose-Capillary: 76 mg/dL (ref 70–99)

## 2012-06-20 LAB — BASIC METABOLIC PANEL
BUN: 37 mg/dL — ABNORMAL HIGH (ref 6–23)
Calcium: 9.1 mg/dL (ref 8.4–10.5)
Creatinine, Ser: 1.15 mg/dL — ABNORMAL HIGH (ref 0.50–1.10)
GFR calc Af Amer: 47 mL/min — ABNORMAL LOW (ref >90)
GFR calc non Af Amer: 41 mL/min — ABNORMAL LOW (ref >90)

## 2012-06-20 MED ORDER — ACETAMINOPHEN 325 MG PO TABS
650.0000 mg | ORAL_TABLET | ORAL | Status: DC | PRN
  Administered 2012-06-20 (×2): 650 mg
  Filled 2012-06-20 (×2): qty 2

## 2012-06-20 MED ORDER — SUCCINYLCHOLINE CHLORIDE 20 MG/ML IJ SOLN
INTRAMUSCULAR | Status: AC | PRN
  Administered 2012-06-20: 100 mg via INTRAVENOUS

## 2012-06-20 MED ORDER — DIGOXIN 0.25 MG/ML IJ SOLN
0.1250 mg | Freq: Every day | INTRAMUSCULAR | Status: DC
  Administered 2012-06-20 – 2012-07-01 (×12): 0.125 mg via INTRAVENOUS
  Filled 2012-06-20 (×12): qty 0.5

## 2012-06-20 MED ORDER — CHLORHEXIDINE GLUCONATE 0.12 % MT SOLN
OROMUCOSAL | Status: AC
  Filled 2012-06-20: qty 15

## 2012-06-20 MED ORDER — PROPOFOL 10 MG/ML IV EMUL
5.0000 ug/kg/min | INTRAVENOUS | Status: DC
  Administered 2012-06-20 (×3): 5 ug/kg/min via INTRAVENOUS
  Administered 2012-06-20: 10 ug/kg/min via INTRAVENOUS
  Administered 2012-06-21: 5 ug/kg/min via INTRAVENOUS
  Administered 2012-06-22 (×2): 10 ug/kg/min via INTRAVENOUS
  Filled 2012-06-20 (×8): qty 100

## 2012-06-20 MED ORDER — DIGOXIN 0.25 MG/ML IJ SOLN
0.1250 mg | Freq: Once | INTRAMUSCULAR | Status: DC
  Filled 2012-06-20: qty 0.5

## 2012-06-20 MED ORDER — SODIUM CHLORIDE 0.9 % IV BOLUS (SEPSIS)
500.0000 mL | Freq: Once | INTRAVENOUS | Status: AC
  Administered 2012-06-20: 500 mL via INTRAVENOUS

## 2012-06-20 MED ORDER — BIOTENE DRY MOUTH MT LIQD
15.0000 mL | Freq: Four times a day (QID) | OROMUCOSAL | Status: DC
  Administered 2012-06-20 – 2012-07-03 (×49): 15 mL via OROMUCOSAL

## 2012-06-20 MED ORDER — DIGOXIN 0.25 MG/ML IJ SOLN
0.2500 mg | Freq: Once | INTRAMUSCULAR | Status: AC
  Administered 2012-06-20: 0.25 mg via INTRAVENOUS
  Filled 2012-06-20: qty 1

## 2012-06-20 MED ORDER — DIGOXIN 0.25 MG/ML IJ SOLN
0.1250 mg | Freq: Once | INTRAMUSCULAR | Status: AC
  Administered 2012-06-20: 0.125 mg via INTRAVENOUS
  Filled 2012-06-20: qty 0.5

## 2012-06-20 MED ORDER — DILTIAZEM HCL 100 MG IV SOLR
5.0000 mg/h | INTRAVENOUS | Status: DC
  Administered 2012-06-20 (×2): 5 mg/h via INTRAVENOUS
  Filled 2012-06-20: qty 100

## 2012-06-20 MED ORDER — METHOCARBAMOL 500 MG PO TABS
ORAL_TABLET | ORAL | Status: AC
  Filled 2012-06-20: qty 1

## 2012-06-20 MED ORDER — FENTANYL BOLUS VIA INFUSION
25.0000 ug | Freq: Four times a day (QID) | INTRAVENOUS | Status: DC | PRN
  Administered 2012-06-21: 50 ug via INTRAVENOUS
  Filled 2012-06-20: qty 100

## 2012-06-20 MED ORDER — FLUCONAZOLE 100 MG PO TABS
100.0000 mg | ORAL_TABLET | Freq: Every day | ORAL | Status: AC
  Administered 2012-06-21 – 2012-06-23 (×3): 100 mg
  Filled 2012-06-20 (×4): qty 1

## 2012-06-20 MED ORDER — ETOMIDATE 2 MG/ML IV SOLN
INTRAVENOUS | Status: AC | PRN
  Administered 2012-06-20: 12 mg via INTRAVENOUS

## 2012-06-20 MED ORDER — SODIUM CHLORIDE 0.9 % IV SOLN
INTRAVENOUS | Status: AC | PRN
  Administered 2012-06-20: 08:00:00 via INTRAVENOUS

## 2012-06-20 MED ORDER — CHLORHEXIDINE GLUCONATE 0.12 % MT SOLN
15.0000 mL | Freq: Two times a day (BID) | OROMUCOSAL | Status: DC
  Administered 2012-06-20 – 2012-06-29 (×19): 15 mL via OROMUCOSAL
  Filled 2012-06-20 (×20): qty 15

## 2012-06-20 MED ORDER — SODIUM CHLORIDE 0.9 % IV SOLN
25.0000 ug/h | INTRAVENOUS | Status: DC
  Administered 2012-06-20: 25 ug/h via INTRAVENOUS
  Administered 2012-06-22: 50 ug/h via INTRAVENOUS
  Administered 2012-06-24: 75 ug/h via INTRAVENOUS
  Filled 2012-06-20 (×3): qty 50

## 2012-06-20 MED ORDER — DIGOXIN 0.25 MG/ML IJ SOLN: 0.1250 mg | Freq: Every day | INTRAMUSCULAR | Status: DC

## 2012-06-20 NOTE — Procedures (Signed)
Intubation Note. Called by ICU to intubated this 79 year old patient with multiple medical problems presently for respiratory distress. On arrival patient was in respiratory distress and after brief assessment decision made to intubate. Etomidate 12mg  and succinylcholine 100mg  given. Patient was intubated on the initial attempt by CRNA Sarah with a #3 Mac blade  #7.5 ETT without difficulty. Positive ETCO2 by easy cap. BLBS adnd tube secured by RT.  Plan: Neuro ICU care per Dr. Janee Morn present at bedsied. CE

## 2012-06-20 NOTE — Progress Notes (Signed)
  Echocardiogram 2D Echocardiogram has been performed.  Georgian Co 06/20/2012, 2:42 PM
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
## 2012-06-20 NOTE — Progress Notes (Signed)
Pt's A-line continues to fluctuate and BP cuff is reading systolic in the 80's. Notified Dr. Janee Morn to verify BP parameters. Dr. Janee Morn stated to go by cuff pressure and BP goal is systolic >85. Will continue to monitor.

## 2012-06-20 NOTE — Progress Notes (Signed)
Patient ID: Sara Cohen, female   DOB: 12/23/1920, 79 y.o.   MRN: [id number] I met with her daughter and granddaughter at the bedside and updated them on the current clinical situation and plan of care.  I explained the severity of the situation and the difficulty we will face eventually getting her off the ventilator in light of her severe baseline COPD.  I also mentioned the possibility of tracheostomy in the near future.  I answered their questions. Violeta Gelinas, MD, MPH, FACS Pager: [phone]

## 2012-06-20 NOTE — Progress Notes (Signed)
Clinical Social Worker reviewed chart-pt now intubated.  SBIRT to be completed once medically appropriate.  CSW to continue to follow and assist as needed.    Angelia Mould, MSW, [place] [phone]

## 2012-06-20 NOTE — Progress Notes (Signed)
We were called to see patient regarding afib but upon review of the chart, she appears to be a patient of Dr. Blanchie Dessert with [facility]. I spoke with Wilburt Finlay PA-C and relayed consult request -- they will see the patient.  Dayna Dunn PA-C
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
## 2012-06-20 NOTE — Procedures (Signed)
Arterial Catheter Insertion Procedure Note TARYNN GARLING [id number] 1921/01/07  Procedure: Insertion of Arterial Catheter  Indications: Blood pressure monitoring  Procedure Details Consent: Unable to obtain consent because of emergent medical necessity. Time Out: Verified patient identification, verified procedure, site/side was marked, verified correct patient position, special equipment/implants available, medications/allergies/relevent history reviewed, required imaging and test results available.  Performed  Maximum sterile technique was used including antiseptics, cap, gloves, gown, hand hygiene and mask. Skin prep: Chlorhexidine; local anesthetic administered 20 gauge catheter was inserted into left radial artery using the Seldinger technique.  Evaluation Blood flow good; BP tracing good. Complications: No apparent complications.   Arterial Line placed successfully x1 attempt in Left radial artery.  Site dopplered + for collateral circulation.  Good waveform noted.  Initial ABP 78/42.  Site cleaned, dressed and secured per protocol.  Will continue to monitor.    Genia Del Apple 06/20/2012
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
## 2012-06-20 NOTE — Progress Notes (Signed)
OT Cancellation Note  Patient Details Name: Sara Cohen MRN: [id number] DOB: [birth year]   Cancelled Treatment:    Reason Eval/Treat Not Completed: Patient not medically ready (Intubated today due to resp distress)  Donise Woodle A 06/20/2012, 12:19 PM

## 2012-06-21 ENCOUNTER — Inpatient Hospital Stay (HOSPITAL_COMMUNITY): Payer: Medicare Other

## 2012-06-21 LAB — GLUCOSE, CAPILLARY
Glucose-Capillary: 114 mg/dL — ABNORMAL HIGH (ref 70–99)
Glucose-Capillary: 115 mg/dL — ABNORMAL HIGH (ref 70–99)
Glucose-Capillary: 79 mg/dL (ref 70–99)
Glucose-Capillary: 79 mg/dL (ref 70–99)

## 2012-06-21 LAB — BLOOD GAS, ARTERIAL
Bicarbonate: 19.4 mEq/L — ABNORMAL LOW (ref 20.0–24.0)
PEEP: 8 cmH2O
pH, Arterial: 7.323 — ABNORMAL LOW (ref 7.350–7.450)
pO2, Arterial: 198 mmHg — ABNORMAL HIGH (ref 80.0–100.0)

## 2012-06-21 LAB — CBC
Hemoglobin: 12 g/dL (ref 12.0–15.0)
MCHC: 35.1 g/dL (ref 30.0–36.0)
RDW: 15.2 % (ref 11.5–15.5)

## 2012-06-21 MED ORDER — PIVOT 1.5 CAL PO LIQD
1000.0000 mL | ORAL | Status: DC
  Administered 2012-06-21 – 2012-06-22 (×2): 1000 mL
  Filled 2012-06-21 (×5): qty 1000

## 2012-06-21 MED ORDER — DEXTROSE 5 % IV SOLN
5.0000 mg/h | INTRAVENOUS | Status: DC
  Administered 2012-06-21 – 2012-06-23 (×2): 10 mg/h via INTRAVENOUS
  Administered 2012-06-24: 5 mg/h via INTRAVENOUS
  Administered 2012-06-25: 10 mg/h via INTRAVENOUS
  Filled 2012-06-21 (×4): qty 100

## 2012-06-21 MED ORDER — DEXTROSE 50 % IV SOLN
INTRAVENOUS | Status: AC
  Filled 2012-06-21: qty 50

## 2012-06-21 MED ORDER — ADULT MULTIVITAMIN LIQUID CH
5.0000 mL | Freq: Every day | ORAL | Status: DC
  Administered 2012-06-21 – 2012-06-24 (×4): 5 mL
  Filled 2012-06-21 (×6): qty 5

## 2012-06-21 MED ORDER — PRO-STAT SUGAR FREE PO LIQD
60.0000 mL | Freq: Two times a day (BID) | ORAL | Status: DC
  Administered 2012-06-21 – 2012-06-24 (×7): 60 mL
  Filled 2012-06-21 (×8): qty 60

## 2012-06-21 MED ORDER — DEXTROSE 50 % IV SOLN
25.0000 mL | Freq: Once | INTRAVENOUS | Status: AC | PRN
  Administered 2012-06-21: 25 mL via INTRAVENOUS

## 2012-06-21 NOTE — Progress Notes (Signed)
I agree with the following treatment note after reviewing documentation.   Johnston, Brynnlie Unterreiner Brynn   OTR/L Pager: [phone] Office: [phone] .   

## 2012-06-21 NOTE — Progress Notes (Signed)
Follow up - Trauma and Critical Care  Patient Details:    Sara Cohen is an 79 y.o. female.  Lines/tubes : Airway 7.5 mm (Active)  Secured at (cm) 23 cm 06/21/2012  7:50 AM  Measured From Lips 06/21/2012  7:50 AM  Secured Location Right 06/21/2012  7:50 AM  Secured By [employer] 06/21/2012  7:50 AM  Tube Holder Repositioned Yes 06/21/2012  7:50 AM  Cuff Pressure (cm H2O) 24 cm H2O 06/20/2012  8:23 AM  Site Condition Dry 06/21/2012  7:50 AM     PICC / Midline Double Lumen 06/18/12 PICC Right Basilic (Active)  Indication for Insertion or Continuance of Line Limited venous access - need for IV therapy >5 days (PICC only) 06/20/2012  9:00 PM  Length mark (cm) 0 cm 06/18/2012 10:03 AM  Site Assessment Clean;Dry;Intact 06/20/2012  9:00 PM  Lumen #1 Status Infusing 06/20/2012  9:00 PM  Lumen #2 Status Infusing 06/20/2012  9:00 PM  Dressing Type Transparent;Occlusive 06/20/2012  9:00 PM  Dressing Status Clean;Dry;Intact 06/20/2012  9:00 PM  Dressing Change Due 06/25/12 06/20/2012  9:00 PM     Arterial Line 06/20/12 Left Radial (Active)  Site Assessment Clean;Dry;Intact 06/20/2012  9:00 PM  Line Status Pulsatile blood flow 06/20/2012  9:00 PM  Art Line Waveform Appropriate 06/20/2012  9:00 PM  Art Line Interventions Zeroed and calibrated;Leveled;Connections checked and tightened;Flushed per protocol;Line pulled back 06/20/2012  9:00 PM  Color/Movement/Sensation Capillary refill less than 3 sec 06/20/2012  9:00 PM  Dressing Type Securing device;Transparent 06/20/2012  9:00 PM  Dressing Status Clean;Dry 06/20/2012  9:00 PM  Interventions Dressing changed 06/20/2012 11:10 AM  Dressing Change Due 06/23/12 06/20/2012 11:10 AM     NG/OG Tube Orogastric Center mouth (Active)  Placement Verification Auscultation 06/21/2012  4:00 AM  Site Assessment Clean;Dry;Intact 06/21/2012  4:00 AM  Status Clamped 06/21/2012  4:00 AM     Urethral Catheter (Active)  Indication for Insertion or Continuance of Catheter Prolonged  immobilization;Urinary output monitoring 06/20/2012  8:00 PM  Site Assessment Clean;Intact 06/20/2012  8:00 PM  Collection Container Standard drainage bag 06/20/2012  8:00 PM  Securement Method Leg strap 06/20/2012  8:00 PM  Urinary Catheter Interventions Unclamped 06/20/2012  8:00 PM  Output (mL) 225 mL 06/18/2012  5:00 PM    Microbiology/Sepsis markers: Results for orders placed during the hospital encounter of 06/17/12  URINE CULTURE     Status: None   Collection Time    06/17/12  2:04 PM      Result Value Range Status   Specimen Description URINE, CLEAN CATCH   Final   Special Requests NONE   Final   Culture  Setup Time 06/17/2012 14:53   Final   Colony Count 50,000 COLONIES/ML   Final   Culture YEAST   Final   Report Status 06/19/2012 FINAL   Final  MRSA PCR SCREENING     Status: None   Collection Time    06/17/12  6:13 PM      Result Value Range Status   MRSA by PCR NEGATIVE  NEGATIVE Final   Comment:            The GeneXpert MRSA Assay (FDA     approved for NASAL specimens     only), is one component of a     comprehensive MRSA colonization     surveillance program. It is not     intended to diagnose MRSA     infection nor to guide or  monitor treatment for     MRSA infections.    Anti-infectives:  Anti-infectives   Start     Dose/Rate Route Frequency Ordered Stop   06/20/12 1000  fluconazole (DIFLUCAN) tablet 100 mg     100 mg Per Tube Daily 06/20/12 0754 06/24/12 0959   06/19/12 1000  fluconazole (DIFLUCAN) tablet 100 mg  Status:  Discontinued     100 mg Oral Daily 06/19/12 0915 06/20/12 0754   06/18/12 1100  [MAR Hold]  vancomycin (VANCOCIN) IVPB 1000 mg/200 mL premix     (On MAR Hold since 06/18/12 1139)   1,000 mg 200 mL/hr over 60 Minutes Intravenous  Once 06/18/12 0855 06/18/12 1154   06/18/12 1000  ciprofloxacin (CIPRO) IVPB 400 mg  Status:  Discontinued    Comments:  For UTI noted on admission   400 mg 200 mL/hr over 60 Minutes Intravenous Every 12 hours  06/18/12 0855 06/19/12 0915      Best Practice/Protocols:  VTE Prophylaxis: Mechanical GI Prophylaxis: Proton Pump Inhibitor Continous Sedation  Consults: Treatment Team:  Budd Palmer, MD    Events:  Subjective:    Overnight Issues: Patient has been very agitated even on sedation.  Granddaughter at the bedside.  Will need to have a family conference today.  Objective:  Vital signs for last 24 hours: Temp:  [99.7 F (37.6 C)-100.8 F (38.2 C)] 99.7 F (37.6 C) (04/04 0700) Pulse Rate:  [65-140] 80 (04/04 0750) Resp:  [17-26] 26 (04/04 0750) BP: (85-169)/(39-90) 106/54 mmHg (04/04 0750) SpO2:  [95 %-100 %] 100 % (04/04 0750) FiO2 (%):  [60 %-100 %] 60 % (04/04 0750)  Hemodynamic parameters for last 24 hours:    Intake/Output from previous day: 04/03 0701 - 04/04 0700 In: 1421.7 [I.V.:1421.7] Out: 955 [Urine:955]  Intake/Output this shift:    Vent settings for last 24 hours: Vent Mode:  [-] PRVC FiO2 (%):  [60 %-100 %] 60 % Set Rate:  [18 bmp-20 bmp] 18 bmp Vt Set:  [400 mL-500 mL] 500 mL PEEP:  [5 cmH20-8 cmH20] 8 cmH20 Plateau Pressure:  [6 cmH20-20 cmH20] 20 cmH20  Physical Exam:  General: Jittery and a bit agitated on the ventilator Neuro: confused, RASS -1 and agitated Resp: diminished breath sounds LUL, wheezes RUL and CXr being reviewed. GI: soft, nontender, BS WNL, no r/g and Has OGT in place, not getting any tube feedings. Extremities: Lots of bruiding and skin tearing, no actual deformities.  Results for orders placed during the hospital encounter of 06/17/12 (from the past 24 hour(s))  CBC     Status: Abnormal   Collection Time    06/20/12  9:17 AM      Result Value Range   WBC 8.7  4.0 - 10.5 K/uL   RBC 4.28  3.87 - 5.11 MIL/uL   Hemoglobin 12.5  12.0 - 15.0 g/dL   HCT 04.5 (*) 40.9 - 81.1 %   MCV 83.6  78.0 - 100.0 fL   MCH 29.2  26.0 - 34.0 pg   MCHC 34.9  30.0 - 36.0 g/dL   RDW 91.4 (*) 78.2 - 95.6 %   Platelets 110 (*) 150 - 400  K/uL  GLUCOSE, CAPILLARY     Status: None   Collection Time    06/20/12  9:32 AM      Result Value Range   Glucose-Capillary 90  70 - 99 mg/dL  BLOOD GAS, ARTERIAL     Status: Abnormal   Collection Time    06/20/12  10:37 AM      Result Value Range   FIO2 100.00     Delivery systems VENTILATOR     Mode PRESSURE REGULATED VOLUME CONTROL     VT 400     Rate 14     Peep/cpap 5.0     pH, Arterial 7.183 (*) 7.350 - 7.450   pCO2 arterial 59.0 (*) 35.0 - 45.0 mmHg   pO2, Arterial 108.0 (*) 80.0 - 100.0 mmHg   Bicarbonate 20.9  20.0 - 24.0 mEq/L   TCO2 22.7  0 - 100 mmol/L   Acid-base deficit 5.9 (*) 0.0 - 2.0 mmol/L   O2 Saturation 98.6     Patient temperature 100.4     Collection site A-LINE     Drawn by [id number]     Sample type ARTERIAL DRAW    GLUCOSE, CAPILLARY     Status: None   Collection Time    06/20/12 11:30 AM      Result Value Range   Glucose-Capillary 84  70 - 99 mg/dL  BLOOD GAS, ARTERIAL     Status: Abnormal   Collection Time    06/20/12  3:40 PM      Result Value Range   FIO2 0.40     Delivery systems VENTILATOR     Mode PRESSURE REGULATED VOLUME CONTROL     VT 500     Rate 18     Peep/cpap 5.0     pH, Arterial 7.456 (*) 7.350 - 7.450   pCO2 arterial 51.1 (*) 35.0 - 45.0 mmHg   pO2, Arterial 88.0  80.0 - 100.0 mmHg   Bicarbonate 35.5 (*) 20.0 - 24.0 mEq/L   TCO2 37.0  0 - 100 mmol/L   Acid-Base Excess 11.0 (*) 0.0 - 2.0 mmol/L   O2 Saturation 97.1     Patient temperature 98.6     Collection site A-LINE     Drawn by [id number]     Sample type ARTERIAL DRAW     Allens test (pass/fail) PASS  PASS  GLUCOSE, CAPILLARY     Status: None   Collection Time    06/20/12  3:42 PM      Result Value Range   Glucose-Capillary 71  70 - 99 mg/dL   Comment 1 Notify RN     Comment 2 Documented in Chart    CBC     Status: Abnormal   Collection Time    06/20/12  4:55 PM      Result Value Range   WBC 10.5  4.0 - 10.5 K/uL   RBC 4.28  3.87 - 5.11 MIL/uL   Hemoglobin 12.7   12.0 - 15.0 g/dL   HCT 38.7 (*) 56.4 - 33.2 %   MCV 82.9  78.0 - 100.0 fL   MCH 29.7  26.0 - 34.0 pg   MCHC 35.8  30.0 - 36.0 g/dL   RDW 95.1 (*) 88.4 - 16.6 %   Platelets 136 (*) 150 - 400 K/uL  GLUCOSE, CAPILLARY     Status: None   Collection Time    06/20/12  8:09 PM      Result Value Range   Glucose-Capillary 76  70 - 99 mg/dL   Comment 1 Notify RN     Comment 2 Documented in Chart    GLUCOSE, CAPILLARY     Status: Abnormal   Collection Time    06/21/12 12:03 AM      Result Value Range   Glucose-Capillary 65 (*) 70 -  99 mg/dL  GLUCOSE, CAPILLARY     Status: Abnormal   Collection Time    06/21/12 12:48 AM      Result Value Range   Glucose-Capillary 115 (*) 70 - 99 mg/dL  CBC     Status: Abnormal   Collection Time    06/21/12  1:10 AM      Result Value Range   WBC 10.0  4.0 - 10.5 K/uL   RBC 4.09  3.87 - 5.11 MIL/uL   Hemoglobin 12.0  12.0 - 15.0 g/dL   HCT 40.9 (*) 81.1 - 91.4 %   MCV 83.6  78.0 - 100.0 fL   MCH 29.3  26.0 - 34.0 pg   MCHC 35.1  30.0 - 36.0 g/dL   RDW 78.2  95.6 - 21.3 %   Platelets 143 (*) 150 - 400 K/uL  BLOOD GAS, ARTERIAL     Status: Abnormal   Collection Time    06/21/12  3:28 AM      Result Value Range   FIO2 1.00     Delivery systems VENTILATOR     Mode PRESSURE REGULATED VOLUME CONTROL     VT 500     Rate 18     Peep/cpap 8.0     pH, Arterial 7.323 (*) 7.350 - 7.450   pCO2 arterial 38.5  35.0 - 45.0 mmHg   pO2, Arterial 198.0 (*) 80.0 - 100.0 mmHg   Bicarbonate 19.4 (*) 20.0 - 24.0 mEq/L   TCO2 20.6  0 - 100 mmol/L   Acid-base deficit 5.6 (*) 0.0 - 2.0 mmol/L   O2 Saturation 99.9     Patient temperature 98.6     Collection site ARTERIAL     Drawn by [id number]     Sample type ARTERIAL    GLUCOSE, CAPILLARY     Status: None   Collection Time    06/21/12  3:52 AM      Result Value Range   Glucose-Capillary 79  70 - 99 mg/dL  GLUCOSE, CAPILLARY     Status: None   Collection Time    06/21/12  7:58 AM      Result Value Range    Glucose-Capillary 82  70 - 99 mg/dL     Assessment/Plan:   NEURO  Altered Mental Status:  agitation, delirium and sedation   Plan: Adjust medications to allow the patient best chance of eventually being extubated.  PULM  Atelectasis/collapse (focal and RLL)   Plan: Continue to ventilate and support  CARDIO  In sinus rhythm today.  Still on Cardiazem drip.  Will appreciate Cardiology assistance.   Plan: Wean off Cardiazem,   RENAL  Oliguria (low effective intravascular volume) and azotemia   Plan: Continue to support and watch renal functiion  GI  No specific issues   Plan: Will consider tube feedings  ID  No known infectious sources   Plan: Empiric antibiotics  HEME  Anemia anemia of chronic disease and anemia of critical illness)   Plan: No blood for now, hemoglobin is stable  ENDO No specific issues   Plan: CPM  Global Issues  Will have a family conference to discus longterm plans in terms of whether or not they wish for her to have a tracheostomy if she cannot be extubated again..  Anemia is stable.  Atrial fibrillation has resolved, and may be able to get patient off the Cardiazem drip later today.    LOS: 4 days   Additional comments:I reviewed the patient's new clinical  lab test results. cbc/bmet and I reviewed the patients new imaging test results. cxr  Critical Care Total Time*: 30 Minutes  Domanic Matusek O 06/21/2012  *Care during the described time interval was provided by me and/or other providers on the critical care team.  I have reviewed this patient's available data, including medical history, events of note, physical examination and test results as part of my evaluation.
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
## 2012-06-21 NOTE — Progress Notes (Signed)
Pt. Went back into A-fib. Rate controlled on 5mg /hr cardizem. Blood pressure within parameters. Will continue to monitor.

## 2012-06-21 NOTE — Progress Notes (Addendum)
PT/OT  Cancellation/Discharge Note  Patient Details Name: Sara Cohen MRN: [id number] DOB: 1920/09/23   Cancelled Treatment:    Reason Eval/Treat Not Completed: Medical issues which prohibited therapy (pt ventilated).  Pt has had a medical decline since the evaluation.  PT/OT to sign off.  Please re-order when and if pt becomes more medically appropriate.     Rollene Rotunda My Rinke, PT, DPT [phone]   06/21/2012, 9:32 AM
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
## 2012-06-21 NOTE — Progress Notes (Signed)
Clinical Social Worker reviewed chart and met with family and MD for family conference; pt remains intubated.  CSW reviewed trach dispo options with family and addressed questions/concerns.  CSW provided emotional support.  CSW to continue to follow and assist as needed.    Angelia Mould, MSW, [place] [phone]

## 2012-06-22 ENCOUNTER — Inpatient Hospital Stay (HOSPITAL_COMMUNITY): Payer: Medicare Other

## 2012-06-22 LAB — CBC WITH DIFFERENTIAL/PLATELET
Eosinophils Absolute: 0.2 10*3/uL (ref 0.0–0.7)
Hemoglobin: 12.4 g/dL (ref 12.0–15.0)
Lymphocytes Relative: 8 % — ABNORMAL LOW (ref 12–46)
Lymphs Abs: 0.8 10*3/uL (ref 0.7–4.0)
MCH: 28.8 pg (ref 26.0–34.0)
MCV: 84.4 fL (ref 78.0–100.0)
Monocytes Relative: 16 % — ABNORMAL HIGH (ref 3–12)
Neutrophils Relative %: 74 % (ref 43–77)
Platelets: 185 10*3/uL (ref 150–400)
RBC: 4.3 MIL/uL (ref 3.87–5.11)
WBC: 10.5 10*3/uL (ref 4.0–10.5)

## 2012-06-22 LAB — BASIC METABOLIC PANEL
Calcium: 9.7 mg/dL (ref 8.4–10.5)
Chloride: 109 mEq/L (ref 96–112)
Creatinine, Ser: 0.87 mg/dL (ref 0.50–1.10)
GFR calc Af Amer: 66 mL/min — ABNORMAL LOW (ref >90)
GFR calc non Af Amer: 57 mL/min — ABNORMAL LOW (ref >90)

## 2012-06-22 LAB — GLUCOSE, CAPILLARY
Glucose-Capillary: 146 mg/dL — ABNORMAL HIGH (ref 70–99)
Glucose-Capillary: 146 mg/dL — ABNORMAL HIGH (ref 70–99)

## 2012-06-22 MED ORDER — BISACODYL 10 MG RE SUPP
10.0000 mg | Freq: Every day | RECTAL | Status: DC | PRN
  Administered 2012-06-22 – 2012-06-28 (×3): 10 mg via RECTAL
  Filled 2012-06-22 (×3): qty 1

## 2012-06-22 MED ORDER — INSULIN ASPART 100 UNIT/ML ~~LOC~~ SOLN
1.0000 [IU] | SUBCUTANEOUS | Status: DC
  Administered 2012-06-22: 1 [IU] via SUBCUTANEOUS
  Administered 2012-06-22 – 2012-06-23 (×3): 2 [IU] via SUBCUTANEOUS
  Administered 2012-06-23: 1 [IU] via SUBCUTANEOUS
  Administered 2012-06-23 – 2012-06-24 (×2): 2 [IU] via SUBCUTANEOUS
  Administered 2012-06-24 – 2012-06-25 (×7): 1 [IU] via SUBCUTANEOUS
  Administered 2012-06-25 (×2): 2 [IU] via SUBCUTANEOUS
  Administered 2012-06-25: 1 [IU] via SUBCUTANEOUS
  Administered 2012-06-26 (×3): 2 [IU] via SUBCUTANEOUS
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
## 2012-06-23 ENCOUNTER — Inpatient Hospital Stay (HOSPITAL_COMMUNITY): Payer: Medicare Other

## 2012-06-23 LAB — GLUCOSE, CAPILLARY
Glucose-Capillary: 152 mg/dL — ABNORMAL HIGH (ref 70–99)
Glucose-Capillary: 119 mg/dL — ABNORMAL HIGH (ref 70–99)
Glucose-Capillary: 132 mg/dL — ABNORMAL HIGH (ref 70–99)
Glucose-Capillary: 120 mg/dL — ABNORMAL HIGH (ref 70–99)

## 2012-06-23 LAB — BASIC METABOLIC PANEL
CO2: 23 mEq/L (ref 19–32)
Calcium: 9.7 mg/dL (ref 8.4–10.5)
GFR calc Af Amer: 74 mL/min — ABNORMAL LOW (ref >90)
GFR calc non Af Amer: 63 mL/min — ABNORMAL LOW (ref >90)
Sodium: 142 mEq/L (ref 135–145)

## 2012-06-23 LAB — CBC
MCH: 29.3 pg (ref 26.0–34.0)
MCHC: 34.5 g/dL (ref 30.0–36.0)
Platelets: 194 10*3/uL (ref 150–400)
RBC: 4.06 MIL/uL (ref 3.87–5.11)

## 2012-06-23 LAB — BLOOD GAS, ARTERIAL
Drawn by: 36274
O2 Saturation: 99.1 %
PEEP: 5 cmH2O
RATE: 18 resp/min
pO2, Arterial: 148 mmHg — ABNORMAL HIGH (ref 80.0–100.0)

## 2012-06-23 LAB — TRIGLYCERIDES: Triglycerides: 118 mg/dL (ref <150)

## 2012-06-23 LAB — MAGNESIUM: Magnesium: 2.1 mg/dL (ref 1.5–2.5)

## 2012-06-23 MED ORDER — FUROSEMIDE 10 MG/ML IJ SOLN
INTRAMUSCULAR | Status: AC
  Filled 2012-06-23: qty 4

## 2012-06-23 MED ORDER — ENOXAPARIN SODIUM 40 MG/0.4ML ~~LOC~~ SOLN
40.0000 mg | SUBCUTANEOUS | Status: DC
  Administered 2012-06-23 – 2012-07-02 (×10): 40 mg via SUBCUTANEOUS
  Filled 2012-06-23 (×13): qty 0.4

## 2012-06-23 MED ORDER — FUROSEMIDE 10 MG/ML IJ SOLN
20.0000 mg | Freq: Once | INTRAMUSCULAR | Status: AC
  Administered 2012-06-23: 20 mg via INTRAVENOUS
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
## 2012-06-24 ENCOUNTER — Inpatient Hospital Stay (HOSPITAL_COMMUNITY): Payer: Medicare Other

## 2012-06-24 LAB — BASIC METABOLIC PANEL
Chloride: 115 mEq/L — ABNORMAL HIGH (ref 96–112)
Creatinine, Ser: 0.66 mg/dL (ref 0.50–1.10)
GFR calc Af Amer: 88 mL/min — ABNORMAL LOW (ref >90)
GFR calc non Af Amer: 76 mL/min — ABNORMAL LOW (ref >90)

## 2012-06-24 LAB — BLOOD GAS, ARTERIAL
Bicarbonate: 24.2 mEq/L — ABNORMAL HIGH (ref 20.0–24.0)
Bicarbonate: 27.7 mEq/L — ABNORMAL HIGH (ref 20.0–24.0)
Drawn by: 36527
Drawn by: 35849
FIO2: 100 %
PEEP: 5 cmH2O
Patient temperature: 98.6
RATE: 16 resp/min
pCO2 arterial: 83.2 mmHg (ref 35.0–45.0)
pH, Arterial: 7.326 — ABNORMAL LOW (ref 7.350–7.450)
pH, Arterial: 7.153 — CL (ref 7.350–7.450)
pO2, Arterial: 97.1 mmHg (ref 80.0–100.0)
pO2, Arterial: 151 mmHg — ABNORMAL HIGH (ref 80.0–100.0)

## 2012-06-24 LAB — CBC
HCT: 35.4 % — ABNORMAL LOW (ref 36.0–46.0)
MCHC: 33.3 g/dL (ref 30.0–36.0)
MCV: 87.8 fL (ref 78.0–100.0)
RDW: 15.6 % — ABNORMAL HIGH (ref 11.5–15.5)

## 2012-06-24 LAB — GLUCOSE, CAPILLARY
Glucose-Capillary: 135 mg/dL — ABNORMAL HIGH (ref 70–99)
Glucose-Capillary: 132 mg/dL — ABNORMAL HIGH (ref 70–99)

## 2012-06-24 MED ORDER — NALOXONE HCL 0.4 MG/ML IJ SOLN: 0.4000 mg | INTRAMUSCULAR | Status: DC | PRN

## 2012-06-24 MED ORDER — DIPHENHYDRAMINE HCL 50 MG/ML IJ SOLN: 12.5000 mg | Freq: Four times a day (QID) | INTRAMUSCULAR | Status: DC | PRN

## 2012-06-24 MED ORDER — ONDANSETRON HCL 4 MG/2ML IJ SOLN: 4.0000 mg | Freq: Four times a day (QID) | INTRAMUSCULAR | Status: DC | PRN

## 2012-06-24 MED ORDER — METOPROLOL TARTRATE 1 MG/ML IV SOLN
5.0000 mg | Freq: Once | INTRAVENOUS | Status: AC
  Administered 2012-06-24: 5 mg via INTRAVENOUS
  Filled 2012-06-24: qty 5

## 2012-06-24 MED ORDER — HALOPERIDOL LACTATE 5 MG/ML IJ SOLN
2.5000 mg | Freq: Once | INTRAMUSCULAR | Status: AC
  Administered 2012-06-25: 2.5 mg via INTRAVENOUS
  Filled 2012-06-24: qty 1

## 2012-06-24 MED ORDER — SODIUM CHLORIDE 0.9 % IJ SOLN: 9.0000 mL | INTRAMUSCULAR | Status: DC | PRN

## 2012-06-24 MED ORDER — RACEPINEPHRINE HCL 2.25 % IN NEBU: 0.5000 mL | INHALATION_SOLUTION | Freq: Once | RESPIRATORY_TRACT | Status: AC

## 2012-06-24 MED ORDER — RACEPINEPHRINE HCL 2.25 % IN NEBU
INHALATION_SOLUTION | RESPIRATORY_TRACT | Status: AC
  Administered 2012-06-24: 0.5 mL via RESPIRATORY_TRACT
  Filled 2012-06-24: qty 0.5

## 2012-06-24 MED ORDER — MORPHINE SULFATE (PF) 1 MG/ML IV SOLN
INTRAVENOUS | Status: DC
  Filled 2012-06-24: qty 25

## 2012-06-24 MED ORDER — FUROSEMIDE 10 MG/ML IJ SOLN
20.0000 mg | Freq: Two times a day (BID) | INTRAMUSCULAR | Status: DC
  Administered 2012-06-25: 20 mg via INTRAVENOUS
  Filled 2012-06-24 (×2): qty 2

## 2012-06-24 MED ORDER — DIPHENHYDRAMINE HCL 12.5 MG/5ML PO ELIX
12.5000 mg | ORAL_SOLUTION | Freq: Four times a day (QID) | ORAL | Status: DC | PRN
  Filled 2012-06-24: qty 5

## 2012-06-24 MED ORDER — ACETAMINOPHEN 10 MG/ML IV SOLN
1000.0000 mg | Freq: Four times a day (QID) | INTRAVENOUS | Status: AC | PRN
  Administered 2012-06-25: 1000 mg via INTRAVENOUS
  Filled 2012-06-24 (×3): qty 100

## 2012-06-24 NOTE — Progress Notes (Signed)
Pt. Seemingly more uncomfortable and unable to breathe as well, O2 sat 88-90% on Venturi mask @ 50%.   Placed on NRB.  Paged Earney Hamburg, PA.  He is to come assess patient for possible further interventions.  Will continue to monitor pt.
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
## 2012-06-24 NOTE — Progress Notes (Signed)
Follow up - Trauma and Critical Care  Patient Details:    Sara Cohen is an 79 y.o. female.  Lines/tubes : Airway 7.5 mm (Active)  Secured at (cm) 23 cm 06/24/2012  7:46 AM  Measured From Lips 06/24/2012  7:46 AM  Secured Location Left 06/24/2012  7:46 AM  Secured By [employer] 06/24/2012  7:46 AM  Tube Holder Repositioned Yes 06/24/2012  7:46 AM  Cuff Pressure (cm H2O) 22 cm H2O 06/24/2012  7:46 AM  Site Condition Dry 06/24/2012  3:02 AM     PICC / Midline Double Lumen 06/18/12 PICC Right Basilic (Active)  Indication for Insertion or Continuance of Line Limited venous access - need for IV therapy >5 days (PICC only);Prolonged intravenous therapies 06/23/2012  8:00 PM  Length mark (cm) 0 cm 06/18/2012 10:03 AM  Site Assessment Clean;Dry;Intact 06/23/2012  8:00 PM  Lumen #1 Status Infusing 06/23/2012  8:00 PM  Lumen #2 Status Infusing 06/23/2012  8:00 PM  Dressing Type Transparent 06/23/2012  8:00 PM  Dressing Status Clean;Dry;Intact 06/23/2012  8:00 PM  Dressing Change Due 06/26/12 06/22/2012  8:00 PM     Arterial Line 06/20/12 Left Radial (Active)  Site Assessment Clean;Dry;Intact 06/23/2012  8:00 PM  Line Status Pulsatile blood flow 06/23/2012  8:00 PM  Art Line Waveform Appropriate 06/23/2012  8:00 PM  Art Line Interventions Zeroed and calibrated 06/23/2012  8:00 PM  Color/Movement/Sensation Capillary refill less than 3 sec 06/23/2012  8:00 PM  Dressing Type Transparent 06/23/2012  8:00 PM  Dressing Status Clean;Dry;Intact 06/23/2012  8:00 PM  Interventions Removed;Other (Comment) 06/23/2012 10:00 PM  Dressing Change Due 06/23/12 06/20/2012 11:10 AM     NG/OG Tube Orogastric Center mouth (Active)  Placement Verification Auscultation 06/24/2012  8:00 AM  Site Assessment Clean;Dry;Intact 06/24/2012  8:00 AM  Status Infusing tube feed 06/24/2012  8:00 AM  Drainage Appearance Tan 06/23/2012  4:00 PM  Gastric Residual 10 mL 06/23/2012  4:00 PM  Intake (mL) 25 mL 06/24/2012 10:00 AM     Urethral Catheter (Active)   Indication for Insertion or Continuance of Catheter Urinary output monitoring;Prolonged immobilization 06/24/2012  8:00 AM  Site Assessment Clean;Intact 06/24/2012  8:00 AM  Collection Container Standard drainage bag 06/24/2012  8:00 AM  Securement Method Leg strap 06/24/2012  8:00 AM  Urinary Catheter Interventions Unclamped 06/24/2012  8:00 AM  Output (mL) 85 mL 06/21/2012 10:00 AM    Microbiology/Sepsis markers: Results for orders placed during the hospital encounter of 06/17/12  URINE CULTURE     Status: None   Collection Time    06/17/12  2:04 PM      Result Value Range Status   Specimen Description URINE, CLEAN CATCH   Final   Special Requests NONE   Final   Culture  Setup Time 06/17/2012 14:53   Final   Colony Count 50,000 COLONIES/ML   Final   Culture YEAST   Final   Report Status 06/19/2012 FINAL   Final  MRSA PCR SCREENING     Status: None   Collection Time    06/17/12  6:13 PM      Result Value Range Status   MRSA by PCR NEGATIVE  NEGATIVE Final   Comment:            The GeneXpert MRSA Assay (FDA     approved for NASAL specimens     only), is one component of a     comprehensive MRSA colonization     surveillance program. It  is not     intended to diagnose MRSA     infection nor to guide or     monitor treatment for     MRSA infections.    Anti-infectives:  Anti-infectives   Start     Dose/Rate Route Frequency Ordered Stop   06/20/12 1000  fluconazole (DIFLUCAN) tablet 100 mg     100 mg Per Tube Daily 06/20/12 0754 06/24/12 0959   06/19/12 1000  fluconazole (DIFLUCAN) tablet 100 mg  Status:  Discontinued     100 mg Oral Daily 06/19/12 0915 06/20/12 0754   06/18/12 1100  [MAR Hold]  vancomycin (VANCOCIN) IVPB 1000 mg/200 mL premix     (On MAR Hold since 06/18/12 1139)   1,000 mg 200 mL/hr over 60 Minutes Intravenous  Once 06/18/12 0855 06/18/12 1154   06/18/12 1000  ciprofloxacin (CIPRO) IVPB 400 mg  Status:  Discontinued    Comments:  For UTI noted on admission   400  mg 200 mL/hr over 60 Minutes Intravenous Every 12 hours 06/18/12 0855 06/19/12 0915      Best Practice/Protocols:  VTE Prophylaxis: Lovenox (prophylaxtic dose) and Mechanical GI Prophylaxis: Proton Pump Inhibitor Continous Sedation  Consults: Treatment Team:  Budd Palmer, MD    Events:  Subjective:    Overnight Issues: Weaning okay.  May be able to get off the ventilator soon.  Objective:  Vital signs for last 24 hours: Temp:  [99 F (37.2 C)-99.9 F (37.7 C)] 99 F (37.2 C) (04/07 0800) Pulse Rate:  [71-102] 78 (04/07 0800) Resp:  [17-26] 18 (04/07 0800) BP: (120-155)/(44-78) 132/55 mmHg (04/07 0800) SpO2:  [40 %-98 %] 97 % (04/07 0800) FiO2 (%):  [40 %] 40 % (04/07 0800) Weight:  [72.8 kg (160 lb 7.9 oz)] 72.8 kg (160 lb 7.9 oz) (04/07 0300)  Hemodynamic parameters for last 24 hours: CVP:  [4 mmHg-7 mmHg] 4 mmHg  Intake/Output from previous day: 04/06 0701 - 04/07 0700 In: 2254.9 [I.V.:1474.9; NG/GT:780] Out: 3095 [Urine:3095]  Intake/Output this shift: Total I/O In: 215.7 [I.V.:140.7; NG/GT:75] Out: 450 [Urine:450]  Vent settings for last 24 hours: Vent Mode:  [-] CPAP;PSV FiO2 (%):  [40 %] 40 % Set Rate:  [16 bmp] 16 bmp Vt Set:  [500 mL] 500 mL PEEP:  [5 cmH20] 5 cmH20 Pressure Support:  [10 cmH20-15 cmH20] 10 cmH20 Plateau Pressure:  [18 cmH20-21 cmH20] 21 cmH20  Physical Exam:  General: alert and no respiratory distress Neuro: alert, oriented and nonfocal exam Resp: clear to auscultation bilaterally CVS: regular rate and rhythm, S1, S2 normal, no murmur, click, rub or gallop and Intermittent Afib, on cardiazem drip. GI: soft, nontender, BS WNL, no r/g Extremities: no edema, no erythema, pulses WNL and edema 1+  Results for orders placed during the hospital encounter of 06/17/12 (from the past 24 hour(s))  GLUCOSE, CAPILLARY     Status: Abnormal   Collection Time    06/23/12 11:42 AM      Result Value Range   Glucose-Capillary 121 (*) 70  - 99 mg/dL  GLUCOSE, CAPILLARY     Status: Abnormal   Collection Time    06/23/12  3:07 PM      Result Value Range   Glucose-Capillary 127 (*) 70 - 99 mg/dL  GLUCOSE, CAPILLARY     Status: Abnormal   Collection Time    06/23/12  7:10 PM      Result Value Range   Glucose-Capillary 120 (*) 70 - 99 mg/dL  GLUCOSE, CAPILLARY  Status: Abnormal   Collection Time    06/23/12 11:53 PM      Result Value Range   Glucose-Capillary 152 (*) 70 - 99 mg/dL  GLUCOSE, CAPILLARY     Status: Abnormal   Collection Time    06/24/12  3:03 AM      Result Value Range   Glucose-Capillary 124 (*) 70 - 99 mg/dL  BLOOD GAS, ARTERIAL     Status: Abnormal   Collection Time    06/24/12  3:33 AM      Result Value Range   FIO2 0.40     Delivery systems VENTILATOR     Mode PRESSURE REGULATED VOLUME CONTROL     VT 500     Rate 16     Peep/cpap 5.0     pH, Arterial 7.326 (*) 7.350 - 7.450   pCO2 arterial 47.7 (*) 35.0 - 45.0 mmHg   pO2, Arterial 97.1  80.0 - 100.0 mmHg   Bicarbonate 24.2 (*) 20.0 - 24.0 mEq/L   TCO2 25.7  0 - 100 mmol/L   Acid-base deficit 1.0  0.0 - 2.0 mmol/L   O2 Saturation 97.5     Patient temperature 98.6     Collection site RIGHT RADIAL     Drawn by [phone]     Sample type ARTERIAL     Allens test (pass/fail) PASS  PASS  CBC     Status: Abnormal   Collection Time    06/24/12  5:00 AM      Result Value Range   WBC 12.6 (*) 4.0 - 10.5 K/uL   RBC 4.03  3.87 - 5.11 MIL/uL   Hemoglobin 11.8 (*) 12.0 - 15.0 g/dL   HCT 60.4 (*) 54.0 - 98.1 %   MCV 87.8  78.0 - 100.0 fL   MCH 29.3  26.0 - 34.0 pg   MCHC 33.3  30.0 - 36.0 g/dL   RDW 19.1 (*) 47.8 - 29.5 %   Platelets 242  150 - 400 K/uL  BASIC METABOLIC PANEL     Status: Abnormal   Collection Time    06/24/12  5:00 AM      Result Value Range   Sodium 147 (*) 135 - 145 mEq/L   Potassium 3.8  3.5 - 5.1 mEq/L   Chloride 115 (*) 96 - 112 mEq/L   CO2 24  19 - 32 mEq/L   Glucose, Bld 133 (*) 70 - 99 mg/dL   BUN 78 (*) 6 - 23  mg/dL   Creatinine, Ser 6.21  0.50 - 1.10 mg/dL   Calcium 9.8  8.4 - 30.8 mg/dL   GFR calc non Af Amer 76 (*) >90 mL/min   GFR calc Af Amer 88 (*) >90 mL/min  GLUCOSE, CAPILLARY     Status: Abnormal   Collection Time    06/24/12  8:06 AM      Result Value Range   Glucose-Capillary 135 (*) 70 - 99 mg/dL     Assessment/Plan:   NEURO  Actually very clear considering everything.   Plan: Wean sedation for eventual extubation.  PULM  Respiratory Acidosis (chronic and due to intrinsic lung disease)   Plan: Wean ventilator  CARDIO  Ventricular Fibrillation, Sinus Tachycardia and intermittent afib   Plan: Still on cardiazem drip.  Will see what Cardiology wants to do.  RENAL  No issues with output, but BUN up to 78   Plan: CPM  GI  No specific issues   Plan: Feed when extubated.  ID  No specific issues   Plan: CPM  HEME  Anemia acute blood loss anemia and anemia of critical illness)   Plan: No blood for now, Hgb has remained stable for several days now.  ENDO No issues apprently, but will try to measure sortisol level if BP becomae a problem again.   Plan: CPM  Global Issues  Patient to be extubated today.  The sons have decided that they want her re-intubated if she begins to fail and subsequently get a trach.  Daughter is not in agreement with them.  Will work hard to optimized pulmonary staus prior to extubation.    LOS: 7 days   Additional comments:I reviewed the patient's new clinical lab test results. cbc/abg/bmet and I reviewed the patients new imaging test results. cxr  Critical Care Total Time*: 30 Minutes  Jahmani Staup O 06/24/2012  *Care during the described time interval was provided by me and/or other providers on the critical care team.  I have reviewed this patient's available data, including medical history, events of note, physical examination and test results as part of my evaluation.

## 2012-06-24 NOTE — Progress Notes (Signed)
Pt. Extubated at 1235.  Pt. Oriented and able to follow commands.  Pt. was asked if needed, if she wants to be re-intubated.  Pt. Stated "NO"  Witnessed by Amie Critchley, RN and Ferman Hamming, RN.  Will continue to monitor pt.

## 2012-06-24 NOTE — Clinical Social Work Note (Signed)
Clinical Social Worker continuing to follow patient and family for emotional support and discharge planning needs.  Patient was extubated today and initially tolerated well however was placed on non rebreather in the afternoon.  Patient daughter at bedside meeting with Chaplain services.  Patient family torn between reintubation and trach if needed or no reintubation and more of a comfort measure approach.  CSW will continue to follow for support and to facilitate appropriate discharge needs pending patient outcome.    Macario Golds, [place] [phone]
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
## 2012-06-24 NOTE — Progress Notes (Signed)
CRITICAL VALUE ALERT  Critical value received:  pH 7.16, CO2 80.7  Date of notification:  06/24/12  Time of notification:  1530  Critical value read back: yes  Nurse who received alert:  Amie Critchley, RN  MD notified (1st page):  Earney Hamburg, PA  Time of first page:  1532  MD notified (2nd page):  Time of second page:  Responding MD:  Earney Hamburg, PA  Time MD responded:  [phone]

## 2012-06-24 NOTE — Progress Notes (Signed)
UR completed 

## 2012-06-24 NOTE — Procedures (Signed)
**Note De-Identified Deatra Mcmahen Obfuscation** Extubation Procedure Note  Patient Details:   Name: Sara Cohen DOB: 01/30/1921 MRN: [id number]   Airway Documentation:  Airway 7.5 mm (Active)  Secured at (cm) 23 cm 06/24/2012  7:46 AM  Measured From Lips 06/24/2012  7:46 AM  Secured Location Left 06/24/2012  7:46 AM  Secured By [employer] 06/24/2012  7:46 AM  Tube Holder Repositioned Yes 06/24/2012  7:46 AM  Cuff Pressure (cm H2O) 22 cm H2O 06/24/2012  7:46 AM  Site Condition Dry 06/24/2012  3:02 AM    Evaluation  O2 sats: stable throughout Complications: No apparent complications Patient did tolerate procedure well. Bilateral Breath Sounds: Diminished;Expiratory wheezes Suctioning: Airway Yes Patient extubated to Venturi mask per MD, FIO2 50% to maintain SAT >90%.  Patient tolerated well, VS WNL, BBS CLR/DIM, no stridor noted. Ronnetta Currington, Megan Salon 06/24/2012, 12:35 PM

## 2012-06-24 NOTE — Progress Notes (Signed)
Received order for swallow eval. Noted recent extubation after prolonged intubation. Will f/u for eval completion 4/8 am.   Ferdinand Lango MA, CCC-SLP [phone]

## 2012-06-25 DIAGNOSIS — J9600 Acute respiratory failure, unspecified whether with hypoxia or hypercapnia: Secondary | ICD-10-CM | POA: Diagnosis present

## 2012-06-25 LAB — CBC WITH DIFFERENTIAL/PLATELET
Basophils Absolute: 0.1 10*3/uL (ref 0.0–0.1)
Basophils Relative: 0 % (ref 0–1)
Eosinophils Relative: 0 % (ref 0–5)
Lymphs Abs: 0.8 10*3/uL (ref 0.7–4.0)
MCH: 29.3 pg (ref 26.0–34.0)
MCHC: 33 g/dL (ref 30.0–36.0)
Metamyelocytes Relative: 0 %
Monocytes Relative: 0 % — ABNORMAL LOW (ref 3–12)
Myelocytes: 0 %
Neutro Abs: 12.7 10*3/uL — ABNORMAL HIGH (ref 1.7–7.7)
Neutrophils Relative %: 0 % — ABNORMAL LOW (ref 43–77)
RDW: 16 % — ABNORMAL HIGH (ref 11.5–15.5)

## 2012-06-25 LAB — BASIC METABOLIC PANEL
CO2: 29 mEq/L (ref 19–32)
Chloride: 121 mEq/L — ABNORMAL HIGH (ref 96–112)
Creatinine, Ser: 0.61 mg/dL (ref 0.50–1.10)
GFR calc Af Amer: >90 mL/min (ref >90)
Potassium: 4.1 mEq/L (ref 3.5–5.1)

## 2012-06-25 LAB — PRO B NATRIURETIC PEPTIDE: Pro B Natriuretic peptide (BNP): 3690 pg/mL — ABNORMAL HIGH (ref 0–450)

## 2012-06-25 LAB — GLUCOSE, CAPILLARY
Glucose-Capillary: 116 mg/dL — ABNORMAL HIGH (ref 70–99)
Glucose-Capillary: 171 mg/dL — ABNORMAL HIGH (ref 70–99)

## 2012-06-25 LAB — DIGOXIN LEVEL: Digoxin Level: 1.1 ng/mL (ref 0.8–2.0)

## 2012-06-25 MED ORDER — FUROSEMIDE 10 MG/ML IJ SOLN
40.0000 mg | Freq: Two times a day (BID) | INTRAMUSCULAR | Status: AC
  Administered 2012-06-25 – 2012-06-26 (×2): 40 mg via INTRAVENOUS
  Filled 2012-06-25: qty 4

## 2012-06-25 MED ORDER — METHYLPREDNISOLONE SODIUM SUCC 125 MG IJ SOLR
125.0000 mg | Freq: Four times a day (QID) | INTRAMUSCULAR | Status: DC
  Administered 2012-06-25 – 2012-06-26 (×4): 125 mg via INTRAVENOUS
  Filled 2012-06-25 (×9): qty 2

## 2012-06-25 MED ORDER — DEXTROSE 5 % IV SOLN
INTRAVENOUS | Status: DC
  Administered 2012-06-25 – 2012-06-27 (×4): via INTRAVENOUS

## 2012-06-25 MED ORDER — HYDRALAZINE HCL 20 MG/ML IJ SOLN
10.0000 mg | Freq: Once | INTRAMUSCULAR | Status: AC
  Administered 2012-06-25: 10 mg via INTRAVENOUS
  Filled 2012-06-25: qty 1

## 2012-06-25 MED ORDER — FUROSEMIDE 10 MG/ML IJ SOLN
INTRAMUSCULAR | Status: AC
  Administered 2012-06-25: 40 mg
  Filled 2012-06-25: qty 4

## 2012-06-25 MED ORDER — MORPHINE SULFATE 2 MG/ML IJ SOLN
2.0000 mg | INTRAMUSCULAR | Status: DC | PRN
  Administered 2012-06-25 – 2012-07-01 (×22): 2 mg via INTRAVENOUS
  Filled 2012-06-25 (×21): qty 1

## 2012-06-25 MED ORDER — MORPHINE SULFATE 2 MG/ML IJ SOLN
INTRAMUSCULAR | Status: AC
  Filled 2012-06-25: qty 1

## 2012-06-25 MED ORDER — HYDRALAZINE HCL 20 MG/ML IJ SOLN
10.0000 mg | INTRAMUSCULAR | Status: DC | PRN
  Administered 2012-06-25 – 2012-06-26 (×4): 10 mg via INTRAVENOUS
  Filled 2012-06-25 (×4): qty 1

## 2012-06-25 NOTE — Progress Notes (Addendum)
Thank you for consulting the Palliative Medicine Team at [facility] to meet your patient's and family's needs.   The reason that you asked [place] to see your patient is for goals of care  We have scheduled your patient for a meeting: tomorrow, Wednesday 06/26/12 @ 12 noon  The Surrogate decision maker is: - per chart notes and staff report; patient has been making her own decisions; on this visit, patient alert but easily fatigued with some increased WOB on 50% Venturi mask; speech difficult at times to [facility] was agreeable to meeting with PMT and members of her family; she stated call Sara Cohen when asked who should be involved; when asked if she had ever told her children who she would want to make decisions for her and she replied "yes, Sara Cohen"  Contact information: son, Sara Cohen h: [phone]  Other family members that need to be present: per [place] his sister and brother will be present  Your patient is able/unable to participate: patient hopeful to participate  Additional Narrative: attempted to contact son Sara Cohen who had just left his home in [place] to come to the hospital will follow-up and speak with son when he arrives; spoke with son [place] - meeting scheduled as noted above    Valente David, RN 06/25/2012, 2:49 PM Palliative Medicine Team RN Liaison [phone]

## 2012-06-25 NOTE — Progress Notes (Signed)
Pt. Not very responsive to hydralazine.  BP 181/79.  Spoke with Earney Hamburg, PA and he stated that he does not feel the need to intervene any further at this time.  Also spoke with him about pt's HR around 110 in A-fib.  No other interventions at this time.  Will continue to monitor.
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
## 2012-06-25 NOTE — Progress Notes (Signed)
Physical Therapy Discharge Patient Details Name: Sara Cohen MRN: [id number] DOB: Feb 24, 1921 Today's Date: 06/25/2012 Time:  -     Patient discharged from PT services secondary to medical decline - will need to re-order PT to resume therapy services.  Please see latest therapy progress note for current level of functioning and progress toward goals.    Progress and discharge plan discussed with patient and/or caregiver: Patient unable to participate in discharge planning and no caregivers available  GP     [facility] 06/25/2012, 10:25 AM

## 2012-06-25 NOTE — Progress Notes (Signed)
Occupational / Physical Therapy Discharge Patient Details Name: Sara Cohen MRN: [id number] DOB: Jul 12, 1920 Today's Date: 06/25/2012 Time:  -     Patient discharged from OT services secondary to medical decline - will need to re-order OT to resume therapy services.  Please see latest therapy progress note for current level of functioning and progress toward goals.    Progress and discharge plan discussed with patient and/or caregiver: Patient unable to participate in discharge planning and no caregivers available  GO     Lucile Shutters Pager: [phone]  06/25/2012, 10:23 AM
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
## 2012-06-26 ENCOUNTER — Inpatient Hospital Stay (HOSPITAL_COMMUNITY): Payer: Medicare Other

## 2012-06-26 DIAGNOSIS — I4891 Unspecified atrial fibrillation: Secondary | ICD-10-CM

## 2012-06-26 DIAGNOSIS — J9600 Acute respiratory failure, unspecified whether with hypoxia or hypercapnia: Secondary | ICD-10-CM

## 2012-06-26 LAB — BASIC METABOLIC PANEL
BUN: 59 mg/dL — ABNORMAL HIGH (ref 6–23)
CO2: 31 mEq/L (ref 19–32)
Chloride: 117 mEq/L — ABNORMAL HIGH (ref 96–112)
Creatinine, Ser: 0.64 mg/dL (ref 0.50–1.10)
GFR calc Af Amer: 89 mL/min — ABNORMAL LOW (ref >90)

## 2012-06-26 LAB — CBC
HCT: 40.5 % (ref 36.0–46.0)
MCV: 87.9 fL (ref 78.0–100.0)
RDW: 15.9 % — ABNORMAL HIGH (ref 11.5–15.5)
WBC: 15.4 10*3/uL — ABNORMAL HIGH (ref 4.0–10.5)

## 2012-06-26 LAB — TRIGLYCERIDES: Triglycerides: 113 mg/dL (ref <150)

## 2012-06-26 LAB — GLUCOSE, CAPILLARY
Glucose-Capillary: 115 mg/dL — ABNORMAL HIGH (ref 70–99)
Glucose-Capillary: 179 mg/dL — ABNORMAL HIGH (ref 70–99)
Glucose-Capillary: 217 mg/dL — ABNORMAL HIGH (ref 70–99)

## 2012-06-26 MED ORDER — DILTIAZEM HCL 25 MG/5ML IV SOLN
10.0000 mg | Freq: Once | INTRAVENOUS | Status: AC
  Administered 2012-06-26: 10 mg via INTRAVENOUS
  Filled 2012-06-26: qty 5

## 2012-06-26 MED ORDER — LORAZEPAM 2 MG/ML IJ SOLN
0.5000 mg | INTRAMUSCULAR | Status: DC | PRN
  Administered 2012-06-26: 0.5 mg via INTRAVENOUS
  Filled 2012-06-26: qty 1

## 2012-06-26 MED ORDER — HYDROCHLOROTHIAZIDE 12.5 MG PO CAPS
12.5000 mg | ORAL_CAPSULE | Freq: Every day | ORAL | Status: DC
  Administered 2012-06-26 – 2012-07-03 (×8): 12.5 mg via ORAL
  Filled 2012-06-26 (×8): qty 1

## 2012-06-26 MED ORDER — METOPROLOL TARTRATE 12.5 MG HALF TABLET
12.5000 mg | ORAL_TABLET | Freq: Four times a day (QID) | ORAL | Status: DC
  Administered 2012-06-26 – 2012-07-03 (×24): 12.5 mg via ORAL
  Filled 2012-06-26 (×34): qty 1

## 2012-06-26 MED ORDER — DILTIAZEM HCL 100 MG IV SOLR: 10.0000 mg/h | INTRAVENOUS | Status: DC

## 2012-06-26 MED ORDER — METHYLPREDNISOLONE SODIUM SUCC 125 MG IJ SOLR
125.0000 mg | Freq: Three times a day (TID) | INTRAMUSCULAR | Status: DC
  Administered 2012-06-26 – 2012-06-28 (×6): 125 mg via INTRAVENOUS
  Filled 2012-06-26 (×9): qty 2

## 2012-06-26 MED ORDER — SODIUM CHLORIDE 0.9 % IJ SOLN
INTRAMUSCULAR | Status: AC
  Administered 2012-06-26: 10 mL
  Filled 2012-06-26: qty 20

## 2012-06-26 MED ORDER — ACETAMINOPHEN 10 MG/ML IV SOLN
1000.0000 mg | Freq: Four times a day (QID) | INTRAVENOUS | Status: AC
  Administered 2012-06-26 – 2012-06-27 (×4): 1000 mg via INTRAVENOUS
  Filled 2012-06-26 (×4): qty 100

## 2012-06-26 MED ORDER — METOPROLOL TARTRATE 12.5 MG HALF TABLET
12.5000 mg | ORAL_TABLET | Freq: Two times a day (BID) | ORAL | Status: DC
  Administered 2012-06-26: 12.5 mg via ORAL
  Filled 2012-06-26: qty 1

## 2012-06-26 MED ORDER — INSULIN ASPART 100 UNIT/ML ~~LOC~~ SOLN
0.0000 [IU] | Freq: Three times a day (TID) | SUBCUTANEOUS | Status: DC
  Administered 2012-06-26: 3 [IU] via SUBCUTANEOUS
  Administered 2012-06-27: 2 [IU] via SUBCUTANEOUS

## 2012-06-26 MED ORDER — DILTIAZEM LOAD VIA INFUSION
10.0000 mg | Freq: Once | INTRAVENOUS | Status: DC
  Filled 2012-06-26: qty 10

## 2012-06-26 MED ORDER — INSULIN ASPART 100 UNIT/ML ~~LOC~~ SOLN
0.0000 [IU] | Freq: Every day | SUBCUTANEOUS | Status: DC
  Administered 2012-06-28 – 2012-07-02 (×4): 2 [IU] via SUBCUTANEOUS
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
## 2012-06-26 NOTE — Progress Notes (Signed)
PT was removed from BIPAP, she could not tolerate wearing it. She was placed back on a venti mask at 50%
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
## 2012-06-26 NOTE — Progress Notes (Signed)
Trauma Service Note  Subjective: Patient is struggling along, but looks slightly better today.  Oriented and alert.  Answers questions appropriately. Breathing heavily.  Says that she would like a soft feeding tube if not able to pass swalowing examination.  Objective: Vital signs in last 24 hours: Temp:  [97.5 F (36.4 C)-99 F (37.2 C)] 98.8 F (37.1 C) (04/09 0700) Pulse Rate:  [85-132] 99 (04/09 0700) Resp:  [16-31] 16 (04/09 0700) BP: (109-189)/(56-98) 136/66 mmHg (04/09 0700) SpO2:  [87 %-96 %] 96 % (04/09 0803) FiO2 (%):  [50 %-55 %] 50 % (04/09 0803) Weight:  [69.2 kg (152 lb 8.9 oz)] 69.2 kg (152 lb 8.9 oz) (04/09 0451) Last BM Date: 06/23/12  Intake/Output from previous day: 04/08 0701 - 04/09 0700 In: 2082.5 [I.V.:1982.5; IV Piggyback:100] Out: 4600 [Urine:4600] Intake/Output this shift:    General: Breathing heavily but not able to make long sentences.  Seems very oriented.  Lungs: Wheezes on the right side.  Diminished bilaterally.  CXR not significantly changed.  Still looks a bit wet  Abd: Soft, nontender, cannot eat yet because of swallowing difficulties  Extremities: No changes  Neuro: Intact  Lab Results: CBC   Recent Labs  06/25/12 0500 06/26/12 0415  WBC 15.5* 15.4*  HGB 12.7 13.4  HCT 38.5 40.5  PLT 341 432*   BMET  Recent Labs  06/25/12 0500 06/26/12 0415  NA 157* 159*  K 4.1 3.3*  CL 121* 117*  CO2 29 31  GLUCOSE 129* 204*  BUN 68* 59*  CREATININE 0.61 0.64  CALCIUM 10.7* 11.2*   PT/INR No results found for this basename: LABPROT, INR,  in the last 72 hours ABG  Recent Labs  06/24/12 0333 06/24/12 1444  PHART 7.326* 7.153*  HCO3 24.2* 27.7*    Studies/Results: Dg Chest Port 1 View  06/26/2012  *RADIOLOGY REPORT*  Clinical Data: Respiratory failure  PORTABLE CHEST - 1 VIEW  Comparison: 06/24/2012  Findings: Increased interstitial markings, favored to reflect mild interstitial edema.  Possible small left pleural  effusion.  No pneumothorax.  The heart is normal in size.  Stable right arm PICC.  IMPRESSION: Stable mild interstitial edema with small left pleural effusion.   Original Report Authenticated By: Charline Bills, M.D.    Dg Chest Port 1 View  06/24/2012  *RADIOLOGY REPORT*  Clinical Data: Trauma and hypoxia.  PORTABLE CHEST - 1 VIEW  Comparison: 06/24/2012  Findings: The patient has been extubated.  Nasogastric tube has been removed.  PICC line shows stable positioning with the tip in the SVC.  Lungs show persistent interstitial edema and stable appearing bilateral pleural effusions.  Rib fractures again noted. No evidence of pneumothorax.  IMPRESSION: Stable interstitial edema and bilateral pleural effusions.   Original Report Authenticated By: Irish Lack, M.D.     Anti-infectives: Anti-infectives   Start     Dose/Rate Route Frequency Ordered Stop   06/20/12 1000  fluconazole (DIFLUCAN) tablet 100 mg     100 mg Per Tube Daily 06/20/12 0754 06/24/12 0959   06/19/12 1000  fluconazole (DIFLUCAN) tablet 100 mg  Status:  Discontinued     100 mg Oral Daily 06/19/12 0915 06/20/12 0754   06/18/12 1100  [MAR Hold]  vancomycin (VANCOCIN) IVPB 1000 mg/200 mL premix     (On MAR Hold since 06/18/12 1139)   1,000 mg 200 mL/hr over 60 Minutes Intravenous  Once 06/18/12 0855 06/18/12 1154   06/18/12 1000  ciprofloxacin (CIPRO) IVPB 400 mg  Status:  Discontinued  Comments:  For UTI noted on admission   400 mg 200 mL/hr over 60 Minutes Intravenous Every 12 hours 06/18/12 0855 06/19/12 0915      Assessment/Plan: s/p Procedure(s): SACRO-ILIAC PINNING Continue to support respiratory wise, Swallowing evaluation.  Transfer to SDU is appropriate. Patient is in atrial fibrillation, but her rate is controlled currently, on digoxin, no cardiazem.  LOS: 9 days   Marta Lamas. Gae Bon, MD, FACS [phone] Trauma Surgeon 06/26/2012

## 2012-06-26 NOTE — Progress Notes (Signed)
Pt with HR afib 100s-150s.  SBP 130s-140s.  Nada Boozer, NP notified.  Orders received. Will continue to monitor.    Roselie Awkward, RN
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
## 2012-06-26 NOTE — Progress Notes (Addendum)
Pt. Seen and examined. Agree with the NP/PA-C note as written. Continues in A-fib with rates around 100-110. BP has improved and she is now hypertensive. She is getting D5W for hypernatremia. Palliative care has been consulted and the family wishes to continue aggressive medical therapy. She received lasix yesterday with excellent urine output of almost 2.5L.  Still mildly volume overloaded - EF 60-65%.  On digoxin for rate control - will check digoxin level as she is at steady state and adjust accordingly. Add low dose b-blocker for additional rate control as well as thiazide diuretic which should help with hypernatremia and hypertension.   Chrystie Nose, MD, [facility] Attending Cardiologist [facility]

## 2012-06-27 DIAGNOSIS — J449 Chronic obstructive pulmonary disease, unspecified: Secondary | ICD-10-CM

## 2012-06-27 LAB — CBC WITH DIFFERENTIAL/PLATELET
Eosinophils Absolute: 0 10*3/uL (ref 0.0–0.7)
Hemoglobin: 12.5 g/dL (ref 12.0–15.0)
Lymphocytes Relative: 4 % — ABNORMAL LOW (ref 12–46)
Lymphs Abs: 0.7 10*3/uL (ref 0.7–4.0)
MCH: 28.9 pg (ref 26.0–34.0)
MCV: 89.6 fL (ref 78.0–100.0)
Monocytes Relative: 3 % (ref 3–12)
Neutrophils Relative %: 94 % — ABNORMAL HIGH (ref 43–77)
RBC: 4.33 MIL/uL (ref 3.87–5.11)
WBC: 18.5 10*3/uL — ABNORMAL HIGH (ref 4.0–10.5)

## 2012-06-27 LAB — BASIC METABOLIC PANEL
CO2: 33 mEq/L — ABNORMAL HIGH (ref 19–32)
GFR calc non Af Amer: 52 mL/min — ABNORMAL LOW (ref >90)
Glucose, Bld: 186 mg/dL — ABNORMAL HIGH (ref 70–99)
Potassium: 3.1 mEq/L — ABNORMAL LOW (ref 3.5–5.1)
Sodium: 153 mEq/L — ABNORMAL HIGH (ref 135–145)

## 2012-06-27 LAB — DIGOXIN LEVEL: Digoxin Level: 0.7 ng/mL — ABNORMAL LOW (ref 0.8–2.0)

## 2012-06-27 LAB — GLUCOSE, CAPILLARY
Glucose-Capillary: 155 mg/dL — ABNORMAL HIGH (ref 70–99)
Glucose-Capillary: 167 mg/dL — ABNORMAL HIGH (ref 70–99)

## 2012-06-27 MED ORDER — PANTOPRAZOLE SODIUM 40 MG PO TBEC
40.0000 mg | DELAYED_RELEASE_TABLET | Freq: Every day | ORAL | Status: DC
  Administered 2012-07-01 – 2012-07-02 (×2): 40 mg via ORAL
  Filled 2012-06-27 (×2): qty 1

## 2012-06-27 MED ORDER — DILTIAZEM HCL 100 MG IV SOLR
5.0000 mg/h | INTRAVENOUS | Status: DC
  Administered 2012-06-27 (×2): 5 mg/h via INTRAVENOUS
  Administered 2012-06-28: 07:00:00 via INTRAVENOUS
  Administered 2012-06-28 – 2012-07-01 (×4): 5 mg/h via INTRAVENOUS
  Filled 2012-06-27: qty 100

## 2012-06-27 MED ORDER — POTASSIUM CHLORIDE CRYS ER 20 MEQ PO TBCR
40.0000 meq | EXTENDED_RELEASE_TABLET | ORAL | Status: AC
  Administered 2012-06-27 (×2): 40 meq via ORAL
  Filled 2012-06-27 (×2): qty 2

## 2012-06-27 MED ORDER — STARCH (THICKENING) PO POWD
ORAL | Status: DC | PRN
Start: 2012-06-27 — End: 2012-07-03
  Filled 2012-06-27: qty 227

## 2012-06-27 MED ORDER — FUROSEMIDE 10 MG/ML IJ SOLN
INTRAMUSCULAR | Status: AC
  Administered 2012-06-27: 20 mg via INTRAVENOUS
  Filled 2012-06-27: qty 4

## 2012-06-27 MED ORDER — FUROSEMIDE 10 MG/ML IJ SOLN: 20.0000 mg | Freq: Once | INTRAMUSCULAR | Status: AC

## 2012-06-27 MED ORDER — INSULIN ASPART 100 UNIT/ML ~~LOC~~ SOLN
0.0000 [IU] | Freq: Three times a day (TID) | SUBCUTANEOUS | Status: DC
  Administered 2012-06-27 – 2012-06-28 (×4): 2 [IU] via SUBCUTANEOUS
  Administered 2012-06-29: 1 [IU] via SUBCUTANEOUS
  Administered 2012-06-29: 3 [IU] via SUBCUTANEOUS
  Administered 2012-06-29 – 2012-06-30 (×3): 2 [IU] via SUBCUTANEOUS
  Administered 2012-06-30: 5 [IU] via SUBCUTANEOUS
  Administered 2012-07-01: 3 [IU] via SUBCUTANEOUS
  Administered 2012-07-01: 1 [IU] via SUBCUTANEOUS
  Administered 2012-07-01 – 2012-07-02 (×2): 2 [IU] via SUBCUTANEOUS
  Administered 2012-07-02: 5 [IU] via SUBCUTANEOUS
  Administered 2012-07-02: 3 [IU] via SUBCUTANEOUS

## 2012-06-27 NOTE — Progress Notes (Signed)
Pt's O2 sats decreased to 85% on 5L [place]. Pt placed on 50% venti mask.  Pt having more labored breathing than earlier.  After scheduled breathing nebs given, pt's sats still 89-90% on 50% venti mask.  Dr. Janee Morn notified.  Orders received.  Dr. Janee Morn ok with O2 sats 89% or greater.  Will continue to monitor.  Roselie Awkward, RN

## 2012-06-27 NOTE — Progress Notes (Signed)
Chaplain did follow-up visit. Pt's daughter was present. Pt breathing better today. No longer has breathing mask. Pt grateful to be making progress. I prayed with pt, and pt prayed aloud also. Pt's daughter expressed appreciation for the visit.

## 2012-06-27 NOTE — Progress Notes (Addendum)
Speech Language Pathology Dysphagia Treatment Patient Details Name: Sara Cohen MRN: [id number] DOB: 11/24/1920 Today's Date: 06/27/2012 Time: 1203-1213 SLP Time Calculation (min): 10 min  Assessment / Plan / Recommendation Clinical Impression  Treatment provided to check pts tolerance of nectar thick liquids and reinforce precautions to pt and family. SLP provided verbal instruction and demonstration to pt and family regarding thickening and appropriate texture. Education regarding rationale for thickened liquids provided.  Observed adequate tolerance of modified texture though pt continues to demonstrate respiratory compromise, shortness of breath. Recommend continuation of nectar thick liquids, ensuring close monitoring of appropriate posture for meals and manageable respiratory and O2 saturation prior to consuming PO.     Diet Recommendation  Continue with Current Diet: Dysphagia 1 (puree);Nectar-thick liquid    SLP Plan Continue with current plan of care   Pertinent Vitals/Pain NA   Swallowing Goals  SLP Swallowing Goals Patient will utilize recommended strategies during swallow to increase swallowing safety with: Minimal assistance Swallow Study Goal #2 - Progress: Progressing toward goal  General Temperature Spikes Noted: No Respiratory Status: Supplemental O2 delivered via (comment) Behavior/Cognition: Alert;Cooperative;Pleasant mood Oral Cavity - Dentition: Edentulous Patient Positioning: Upright in bed  Oral Cavity - Oral Hygiene     Dysphagia Treatment Treatment focused on: Skilled observation of diet tolerance;Patient/family/caregiver education;Utilization of compensatory strategies Family/Caregiver Educated: daughter Treatment Methods/Modalities: Skilled observation Patient observed directly with PO's: Yes Type of PO's observed: Nectar-thick liquids Feeding: Able to feed self;Needs assist Liquids provided via: Cup Pharyngeal Phase Signs & Symptoms: Immediate  cough Type of cueing: Verbal Amount of cueing: Minimal   GO    Harlon Ditty, MA CCC-SLP [phone]  Claudine Mouton 06/27/2012, 2:02 PM

## 2012-06-27 NOTE — Progress Notes (Signed)
Pt. Seen and examined. Agree with the NP/PA-C note as written.  She is feeling progressively better. HR and BP better controlled today on current regimen - sodium is improving down to 153 from 159. She is hypokalemic today (3.3) and will require repletion - check magnesium as well. Digoxin level appropriate at 0.7. On lovenox for anticoagulation. Leukocytosis with left shift likely secondary to solumedrol.   Chrystie Nose, MD, [facility] Attending Cardiologist [facility]

## 2012-06-27 NOTE — Progress Notes (Signed)
Pt's HR is 120-130s in Afib. NP notified. New orders received to initiate cardizem gtt at 5mg /hr with a 10mg  bolus. Will administer and continue to monitor.
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
## 2012-06-27 NOTE — Clinical Social Work Note (Addendum)
Clinical Social Worker met with patient, patient son, and patient granddaughter at bedside for continued support and discuss patient plans at discharge.  Patient adamantly states "I do not want to go to a nursing home."  Patient son understands recommendations and will discuss with family the possibility of 24 hour care in the home with [facility] following vs. SNF placement once medically ready.  Patient and patient family did agree for CSW to initiate SNF search in [place] and [address] while they discuss other alternatives.  CSW to initiate SNF search once patient off Venturi Mask and follow up with patient and patient son regarding potential bed offers.  CSW remains available for support and to facilitate patient discharge needs once medically ready.  Macario Golds, [place] [phone]

## 2012-06-28 LAB — GLUCOSE, CAPILLARY
Glucose-Capillary: 179 mg/dL — ABNORMAL HIGH (ref 70–99)
Glucose-Capillary: 214 mg/dL — ABNORMAL HIGH (ref 70–99)

## 2012-06-28 LAB — BASIC METABOLIC PANEL
BUN: 81 mg/dL — ABNORMAL HIGH (ref 6–23)
CO2: 33 mEq/L — ABNORMAL HIGH (ref 19–32)
Chloride: 111 mEq/L (ref 96–112)
Creatinine, Ser: 0.91 mg/dL (ref 0.50–1.10)
Potassium: 4 mEq/L (ref 3.5–5.1)

## 2012-06-28 MED ORDER — ALBUTEROL SULFATE (5 MG/ML) 0.5% IN NEBU
2.5000 mg | INHALATION_SOLUTION | Freq: Four times a day (QID) | RESPIRATORY_TRACT | Status: DC
  Administered 2012-06-28 – 2012-06-29 (×5): 2.5 mg via RESPIRATORY_TRACT
  Filled 2012-06-28 (×5): qty 0.5

## 2012-06-28 MED ORDER — METHYLPREDNISOLONE SODIUM SUCC 125 MG IJ SOLR
125.0000 mg | Freq: Two times a day (BID) | INTRAMUSCULAR | Status: DC
  Administered 2012-06-28 – 2012-07-02 (×8): 125 mg via INTRAVENOUS
  Filled 2012-06-28 (×12): qty 2

## 2012-06-28 MED ORDER — ALBUTEROL SULFATE (5 MG/ML) 0.5% IN NEBU: 2.5000 mg | INHALATION_SOLUTION | RESPIRATORY_TRACT | Status: DC | PRN

## 2012-06-28 MED ORDER — IPRATROPIUM BROMIDE 0.02 % IN SOLN
0.5000 mg | Freq: Four times a day (QID) | RESPIRATORY_TRACT | Status: DC
  Administered 2012-06-28 – 2012-06-29 (×5): 0.5 mg via RESPIRATORY_TRACT
  Filled 2012-06-28 (×5): qty 2.5

## 2012-06-28 MED ORDER — ALBUTEROL SULFATE (5 MG/ML) 0.5% IN NEBU: 2.5000 mg | INHALATION_SOLUTION | RESPIRATORY_TRACT | Status: DC

## 2012-06-28 MED ORDER — IPRATROPIUM BROMIDE 0.02 % IN SOLN: 0.5000 mg | Freq: Four times a day (QID) | RESPIRATORY_TRACT | Status: DC
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
## 2012-06-28 NOTE — Progress Notes (Signed)
UR completed 
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
## 2012-06-29 LAB — CBC WITH DIFFERENTIAL/PLATELET
Basophils Absolute: 0 10*3/uL (ref 0.0–0.1)
Basophils Relative: 0 % (ref 0–1)
HCT: 39.2 % (ref 36.0–46.0)
Hemoglobin: 12.8 g/dL (ref 12.0–15.0)
Lymphocytes Relative: 3 % — ABNORMAL LOW (ref 12–46)
Lymphs Abs: 0.8 10*3/uL (ref 0.7–4.0)
MCV: 88.9 fL (ref 78.0–100.0)
Monocytes Relative: 2 % — ABNORMAL LOW (ref 3–12)
Neutro Abs: 25.3 10*3/uL — ABNORMAL HIGH (ref 1.7–7.7)
RDW: 15.5 % (ref 11.5–15.5)
WBC: 26.6 10*3/uL — ABNORMAL HIGH (ref 4.0–10.5)

## 2012-06-29 LAB — BASIC METABOLIC PANEL
BUN: 77 mg/dL — ABNORMAL HIGH (ref 6–23)
CO2: 32 mEq/L (ref 19–32)
Chloride: 105 mEq/L (ref 96–112)
Creatinine, Ser: 0.72 mg/dL (ref 0.50–1.10)
Potassium: 3.6 mEq/L (ref 3.5–5.1)

## 2012-06-29 LAB — GLUCOSE, CAPILLARY: Glucose-Capillary: 172 mg/dL — ABNORMAL HIGH (ref 70–99)

## 2012-06-29 MED ORDER — ALPRAZOLAM 0.25 MG PO TABS
0.2500 mg | ORAL_TABLET | Freq: Once | ORAL | Status: AC
  Administered 2012-06-29: 0.25 mg via ORAL
  Filled 2012-06-29: qty 1

## 2012-06-29 MED ORDER — SODIUM CHLORIDE 0.45 % IV SOLN
INTRAVENOUS | Status: DC
  Administered 2012-06-29 – 2012-06-30 (×2): 20 mL via INTRAVENOUS

## 2012-06-29 NOTE — Progress Notes (Signed)
Pt. Refuses to wear BIPAP QHS at this time. Pt. Was made aware to call RT anytime during the nigh if she changed her mind & decided to wear BIPAP.

## 2012-06-29 NOTE — Progress Notes (Signed)
11 Days Post-Op  Subjective: No complaints. She thinks she is feeling a little better today  Objective: Vital signs in last 24 hours: Temp:  [97.3 F (36.3 C)-98.4 F (36.9 C)] 97.8 F (36.6 C) (04/12 0800) Pulse Rate:  [80-124] 80 (04/12 0735) Resp:  [18-26] 26 (04/12 0735) BP: (131-161)/(58-101) 154/85 mmHg (04/12 0735) SpO2:  [91 %-95 %] 94 % (04/12 0823) FiO2 (%):  [50 %-100 %] 50 % (04/12 0735) Last BM Date: 06/29/12  Intake/Output from previous day: 04/11 0701 - 04/12 0700 In: 2515 [P.O.:1200; I.V.:1315] Out: 2500 [Urine:2500] Intake/Output this shift: Total I/O In: 410 [P.O.:300; I.V.:110] Out: 100 [Urine:100]  Resp: rhonchi bilaterally GI: soft, mild tenderness on right. good bs  Lab Results:   Recent Labs  06/27/12 0400 06/29/12 0410  WBC 18.5* 26.6*  HGB 12.5 12.8  HCT 38.8 39.2  PLT 456* 457*   BMET  Recent Labs  06/28/12 0430 06/29/12 0410  NA 150* 145  K 4.0 3.6  CL 111 105  CO2 33* 32  GLUCOSE 189* 174*  BUN 81* 77*  CREATININE 0.91 0.72  CALCIUM 10.9* 10.9*   PT/INR No results found for this basename: LABPROT, INR,  in the last 72 hours ABG No results found for this basename: PHART, PCO2, PO2, HCO3,  in the last 72 hours  Studies/Results: No results found.  Anti-infectives: Anti-infectives   Start     Dose/Rate Route Frequency Ordered Stop   06/20/12 1000  fluconazole (DIFLUCAN) tablet 100 mg     100 mg Per Tube Daily 06/20/12 0754 06/24/12 0959   06/19/12 1000  fluconazole (DIFLUCAN) tablet 100 mg  Status:  Discontinued     100 mg Oral Daily 06/19/12 0915 06/20/12 0754   06/18/12 1100  [MAR Hold]  vancomycin (VANCOCIN) IVPB 1000 mg/200 mL premix     (On MAR Hold since 06/18/12 1139)   1,000 mg 200 mL/hr over 60 Minutes Intravenous  Once 06/18/12 0855 06/18/12 1154   06/18/12 1000  ciprofloxacin (CIPRO) IVPB 400 mg  Status:  Discontinued    Comments:  For UTI noted on admission   400 mg 200 mL/hr over 60 Minutes Intravenous  Every 12 hours 06/18/12 0855 06/19/12 0915      Assessment/Plan: s/p Procedure(s): SACRO-ILIAC PINNING (Right) Continue steroids for COPD Monitor closely Leave foley in  LOS: 12 days    TOTH III,PAUL S 06/29/2012

## 2012-06-29 NOTE — Progress Notes (Signed)
Subjective: Patient seen , says she is improving. Denies pain  Filed Vitals:   06/29/12 0800  BP:   Pulse:   Temp: 97.8 F (36.6 C)  Resp:     Chest: Bilateral rhonchi Heart : S1S2 RRR Abdomen: Soft, nontender Ext : Trace  Edema in lower extremities  Neuro: Alert, oriented x 3  A/P Acute respiratory failure  Atrial fibrillation COPD  Patient is improving, and plan for SNF Will sign off, call us if needed.    Meredeth Ide Palliative Medicine Team [phone]

## 2012-06-30 LAB — BASIC METABOLIC PANEL
BUN: 71 mg/dL — ABNORMAL HIGH (ref 6–23)
CO2: 32 mEq/L (ref 19–32)
Calcium: 10.1 mg/dL (ref 8.4–10.5)
Chloride: 106 mEq/L (ref 96–112)
Creatinine, Ser: 0.66 mg/dL (ref 0.50–1.10)

## 2012-06-30 LAB — GLUCOSE, CAPILLARY
Glucose-Capillary: 187 mg/dL — ABNORMAL HIGH (ref 70–99)
Glucose-Capillary: 298 mg/dL — ABNORMAL HIGH (ref 70–99)
Glucose-Capillary: 196 mg/dL — ABNORMAL HIGH (ref 70–99)

## 2012-06-30 MED ORDER — IPRATROPIUM BROMIDE 0.02 % IN SOLN
0.5000 mg | Freq: Three times a day (TID) | RESPIRATORY_TRACT | Status: DC
  Administered 2012-06-30 – 2012-07-03 (×9): 0.5 mg via RESPIRATORY_TRACT
  Filled 2012-06-30 (×10): qty 2.5

## 2012-06-30 MED ORDER — ALBUTEROL SULFATE (5 MG/ML) 0.5% IN NEBU
2.5000 mg | INHALATION_SOLUTION | Freq: Three times a day (TID) | RESPIRATORY_TRACT | Status: DC
  Administered 2012-06-30 – 2012-07-03 (×9): 2.5 mg via RESPIRATORY_TRACT
  Filled 2012-06-30 (×10): qty 0.5

## 2012-06-30 MED ORDER — ALBUTEROL SULFATE (5 MG/ML) 0.5% IN NEBU: 2.5000 mg | INHALATION_SOLUTION | Freq: Four times a day (QID) | RESPIRATORY_TRACT | Status: DC

## 2012-06-30 MED ORDER — ALBUTEROL SULFATE (5 MG/ML) 0.5% IN NEBU: 2.5000 mg | INHALATION_SOLUTION | Freq: Four times a day (QID) | RESPIRATORY_TRACT | Status: DC | PRN

## 2012-06-30 MED ORDER — IPRATROPIUM BROMIDE 0.02 % IN SOLN: 0.5000 mg | Freq: Three times a day (TID) | RESPIRATORY_TRACT | Status: DC

## 2012-06-30 MED ORDER — ALUM & MAG HYDROXIDE-SIMETH 200-200-20 MG/5ML PO SUSP
30.0000 mL | Freq: Four times a day (QID) | ORAL | Status: DC | PRN
  Administered 2012-06-30: 30 mL via ORAL
  Filled 2012-06-30: qty 30

## 2012-06-30 MED ORDER — ALPRAZOLAM 0.25 MG PO TABS
0.2500 mg | ORAL_TABLET | Freq: Three times a day (TID) | ORAL | Status: DC | PRN
  Administered 2012-07-02: 0.25 mg via ORAL
  Filled 2012-06-30: qty 1

## 2012-06-30 NOTE — Progress Notes (Signed)
12 Days Post-Op  Subjective: Pt stable.  Poor pulm toilet.  Tol PO.  Out of bed to chair this AM  Objective: Vital signs in last 24 hours: Temp:  [97.6 F (36.4 C)-98.5 F (36.9 C)] 97.9 F (36.6 C) (04/13 0800) Pulse Rate:  [64-93] 93 (04/13 0740) Resp:  [18-26] 19 (04/13 0740) BP: (134-154)/(58-70) 154/65 mmHg (04/13 0740) SpO2:  [88 %-95 %] 95 % (04/13 0828) Last BM Date: 06/30/12  Intake/Output from previous day: 04/12 0701 - 04/13 0700 In: 1435 [P.O.:740; I.V.:695] Out: 1875 [Urine:1875] Intake/Output this shift:    General appearance: alert and cooperative Resp: coarse bilat BS GI: soft, non-tender; bowel sounds normal; no masses,  no organomegaly  Lab Results:   Recent Labs  06/29/12 0410  WBC 26.6*  HGB 12.8  HCT 39.2  PLT 457*   BMET  Recent Labs  06/29/12 0410 06/30/12 0500  NA 145 146*  K 3.6 3.5  CL 105 106  CO2 32 32  GLUCOSE 174* 175*  BUN 77* 71*  CREATININE 0.72 0.66  CALCIUM 10.9* 10.1   PT/INR No results found for this basename: LABPROT, INR,  in the last 72 hours ABG No results found for this basename: PHART, PCO2, PO2, HCO3,  in the last 72 hours  Studies/Results: No results found.  Anti-infectives: Anti-infectives   Start     Dose/Rate Route Frequency Ordered Stop   06/20/12 1000  fluconazole (DIFLUCAN) tablet 100 mg     100 mg Per Tube Daily 06/20/12 0754 06/24/12 0959   06/19/12 1000  fluconazole (DIFLUCAN) tablet 100 mg  Status:  Discontinued     100 mg Oral Daily 06/19/12 0915 06/20/12 0754   06/18/12 1100  [MAR Hold]  vancomycin (VANCOCIN) IVPB 1000 mg/200 mL premix     (On MAR Hold since 06/18/12 1139)   1,000 mg 200 mL/hr over 60 Minutes Intravenous  Once 06/18/12 0855 06/18/12 1154   06/18/12 1000  ciprofloxacin (CIPRO) IVPB 400 mg  Status:  Discontinued    Comments:  For UTI noted on admission   400 mg 200 mL/hr over 60 Minutes Intravenous Every 12 hours 06/18/12 0855 06/19/12 0915      Assessment/Plan: s/p  Procedure(s): SACRO-ILIAC PINNING (Right) RT for scheduled chest PT and NT suctioning Steroids for COPD Possible placement later this week  LOS: 13 days    Marigene Ehlers., Jed Limerick 06/30/2012
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
## 2012-07-01 ENCOUNTER — Inpatient Hospital Stay (HOSPITAL_COMMUNITY): Payer: Medicare Other

## 2012-07-01 DIAGNOSIS — S2249XA Multiple fractures of ribs, unspecified side, initial encounter for closed fracture: Secondary | ICD-10-CM

## 2012-07-01 DIAGNOSIS — S02609A Fracture of mandible, unspecified, initial encounter for closed fracture: Secondary | ICD-10-CM

## 2012-07-01 DIAGNOSIS — S329XXA Fracture of unspecified parts of lumbosacral spine and pelvis, initial encounter for closed fracture: Secondary | ICD-10-CM

## 2012-07-01 LAB — BASIC METABOLIC PANEL
Calcium: 9.9 mg/dL (ref 8.4–10.5)
GFR calc Af Amer: 88 mL/min — ABNORMAL LOW (ref >90)
GFR calc non Af Amer: 76 mL/min — ABNORMAL LOW (ref >90)
Potassium: 3.7 mEq/L (ref 3.5–5.1)
Sodium: 150 mEq/L — ABNORMAL HIGH (ref 135–145)

## 2012-07-01 LAB — GLUCOSE, CAPILLARY
Glucose-Capillary: 227 mg/dL — ABNORMAL HIGH (ref 70–99)
Glucose-Capillary: 146 mg/dL — ABNORMAL HIGH (ref 70–99)
Glucose-Capillary: 235 mg/dL — ABNORMAL HIGH (ref 70–99)

## 2012-07-01 MED ORDER — DILTIAZEM HCL 60 MG PO TABS
60.0000 mg | ORAL_TABLET | Freq: Three times a day (TID) | ORAL | Status: DC
  Administered 2012-07-01 – 2012-07-03 (×7): 60 mg via ORAL
  Filled 2012-07-01 (×11): qty 1

## 2012-07-01 MED ORDER — WARFARIN - PHARMACIST DOSING INPATIENT: Freq: Every day | Status: DC

## 2012-07-01 MED ORDER — WARFARIN SODIUM 7.5 MG PO TABS
7.5000 mg | ORAL_TABLET | Freq: Once | ORAL | Status: AC
  Administered 2012-07-01: 7.5 mg via ORAL
  Filled 2012-07-01 (×2): qty 1

## 2012-07-01 MED ORDER — ENSURE PUDDING PO PUDG
1.0000 | Freq: Three times a day (TID) | ORAL | Status: DC
  Administered 2012-07-01 – 2012-07-02 (×4): 1 via ORAL

## 2012-07-01 MED ORDER — OXYCODONE HCL 5 MG PO TABS
5.0000 mg | ORAL_TABLET | ORAL | Status: DC | PRN
  Administered 2012-07-01: 5 mg via ORAL
  Filled 2012-07-01: qty 1
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
## 2012-07-01 NOTE — Progress Notes (Signed)
SLP Cancellation Note  Patient Details Name: KALLAN BISCHOFF MRN: [id number] DOB: 1920/05/23   Cancelled treatment:       Reason Eval/Treat Not Completed: Fatigue/lethargy limiting ability to participate. Pt just given morphine. RN reports no improvement in breathing. Pt will continue to need modified diet. SLP will f/u tomorrow.   Harlon Ditty, [place] CCC-SLP [phone]  Claudine Mouton 07/01/2012, 10:39 AM
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
## 2012-07-01 NOTE — Progress Notes (Signed)
Inpatient Diabetes Program Recommendations  AACE/ADA: New Consensus Statement on Inpatient Glycemic Control (2013)  Target Ranges:  Prepandial:   less than 140 mg/dL      Peak postprandial:   less than 180 mg/dL (1-2 hours)      Critically ill patients:  140 - 180 mg/dL   Reason for Visit: Hyperglycemia  Results for Sara, Cohen (MRN [id number]) as of 07/01/2012 12:58  Ref. Range 06/30/2012 08:23 06/30/2012 12:35 06/30/2012 16:56 06/30/2012 21:57 07/01/2012 07:21 07/01/2012 12:25  Glucose-Capillary Latest Range: 70-99 mg/dL 621 (H) 308 (H) 657 (H) 181 (H) 166 (H) 227 (H)   High post-prandial blood sugars   Inpatient Diabetes Program Recommendations Insulin - Meal Coverage: Add Novolog 3 units tidwc if pt eats >50% meal  Note: Will follow.  Thank you. Ailene Ards, RD, LDN, CDE Inpatient Diabetes Coordinator [phone]
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
## 2012-07-01 NOTE — Clinical Social Work Note (Signed)
Clinical Child psychotherapist initiated SNF search in [place] and [place] today due to patient ability to wean from [facility].  Patient does not want to go to SNF at discharge but agreed with family to initiate search.  CSW to follow up with patient and family tomorrow regarding potential bed offers vs. Possible inpatient rehab placement.  CSW remains available for support and to facilitate patient discharge needs once medically stable.  Sara Cohen, [place] [phone]

## 2012-07-01 NOTE — Progress Notes (Signed)
Rehab Admissions Coordinator Note:  Patient was screened by Brock Ra for appropriateness for an Inpatient Acute Rehab Consult.  At this time, we are recommending Inpatient Rehab consult.  Melanee Spry S 07/01/2012, 9:07 AM  I can be reached at [phone].

## 2012-07-01 NOTE — Clinical Social Work Placement (Addendum)
    Clinical Social Work Department CLINICAL SOCIAL WORK PLACEMENT NOTE 07/01/2012  Patient:  Sara Cohen, Sara Cohen  Account Number:  [id number] Admit date:  06/17/2012  Clinical Social Worker:  Macario Golds, LCSW  Date/time:  07/01/2012 12:00 N  Clinical Social Work is seeking post-discharge placement for this patient at the following level of care:   SKILLED NURSING   (*CSW will update this form in Epic as items are completed)   06/28/2012  Patient/family provided with [facility] Department of Clinical Social Work's list of facilities offering this level of care within the geographic area requested by the patient (or if unable, by the patient's family).  06/28/2012  Patient/family informed of their freedom to choose among providers that offer the needed level of care, that participate in Medicare, Medicaid or managed care program needed by the patient, have an available bed and are willing to accept the patient.  06/28/2012  Patient/family informed of [facility]' ownership interest in [facility], as well as of the fact that they are under no obligation to receive care at this facility.  PASARR submitted to EDS on 07/01/2012 PASARR number received from EDS on 07/01/2012  FL2 transmitted to all facilities in geographic area requested by pt/family on  07/01/2012 FL2 transmitted to all facilities within larger geographic area on   Patient informed that his/her managed care company has contracts with or will negotiate with  certain facilities, including the following:     Patient/family informed of bed offers received:  07/02/2012 Patient chooses bed at  [facility] Physician recommends and patient chooses bed at    Patient to be transferred to [facility] on 07/03/2012   Patient to be transferred to facility by  Ambulance  The following physician request were entered in Epic:   Additional Comments: 04/14 - Patient family requesting [place].  No fax out  until today due to [employer] Mask 04/15 - Patient informed of bed offers. Advised CSW intern to speak with her son Renae Fickle to provide bed offers. Son contacted at 1:48pm but no answer. CSW will follow up with son.  Fernande Boyden, Social Work Intern

## 2012-07-01 NOTE — Progress Notes (Signed)
PT Cancellation Note  Patient Details Name: ZARIAH CAVENDISH MRN: [id number] DOB: 1920-05-17   Cancelled Treatment:    Reason Eval/Treat Not Completed: Fatigue/lethargy limiting ability to participate; patient just s/p toileting on [facility] with OT and too fatigued to participate with PT.  Will attempt at later date.   Brinton Brandel,CYNDI 07/01/2012, 4:06 PM
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
## 2012-07-01 NOTE — Progress Notes (Signed)
Occupational Therapy Treatment Patient Details Name: Sara Cohen MRN: [id number] DOB: [birth year] Today's Date: 07/01/2012 Time: 4098-1191 OT Time Calculation (min): 30 min  OT Assessment / Plan / Recommendation Comments on Treatment Session Pt this session successfully used 3n1. Recommend RN staff only use lift to transfer at this time. Pt appear to have incr edema BIL UE / LE. Pt rt hand 2 pitting edema currently. Pt with strong cough unable to produce secretions at this time.     Follow Up Recommendations  SNF;Other (comment);Supervision/Assistance - 24 hour    Barriers to Discharge       Equipment Recommendations  Wheelchair (measurements OT);Wheelchair cushion (measurements OT);Hospital bed;Other (comment)    Recommendations for Other Services    Frequency Min 2X/week   Plan Discharge plan remains appropriate    Precautions / Restrictions Precautions Precautions: Fall Restrictions RLE Weight Bearing: Touchdown weight bearing   Pertinent Vitals/Pain 91 % on nasal cannula HR decr to 50 with activity    ADL  Toilet Transfer: +1 Total assistance Toilet Transfer: Patient Percentage: 20% Toilet Transfer Method: Squat pivot Toilet Transfer Equipment: Raised toilet seat with arms (or 3-in-1 over toilet) Toileting - Clothing Manipulation and Hygiene: +1 Total assistance Where Assessed - Toileting Clothing Manipulation and Hygiene: Sit to stand from 3-in-1 or toilet Transfers/Ambulation Related to ADLs: Pt stand pivot to the Lt side with total +2 pt 20%. Pt required therapist to give max v/c for hand placement. Pt holding onto 3n1 and requried hand over hand to release 3n1. Pt fearful of falling. Pt states "wait" several times during session due to anxiety with mobility.  ADL Comments: Pt is not able to maintain TWB Rt LE. Pt unable to static stand on LT LE. Pt with large void of bowels on 3n1. Pt fatigued with 3n1 transfer. Pt remains SNF appropriate.    OT Diagnosis:    OT  Problem List:   OT Treatment Interventions:     OT Goals Acute Rehab OT Goals OT Goal Formulation: With patient Time For Goal Achievement: 07/12/12 Potential to Achieve Goals: Good ADL Goals Pt Will Perform Grooming: with set-up;Sitting, chair;Supported ADL Goal: Grooming - Progress: Progressing toward goals Pt Will Perform Upper Body Bathing: Sitting, chair;Supported;with supervision Pt Will Perform Upper Body Dressing: with supervision;Sitting, chair;Supported Engineer, water to Toilet: with max assist;Stand pivot transfer;3-in-1 ADL Goal: Statistician - Progress: Progressing toward goals Pt Will Perform Toileting - Hygiene: with max assist;Sit to stand from 3-in-1/toilet ADL Goal: Toileting - Hygiene - Progress: Progressing toward goals Miscellaneous OT Goals Miscellaneous OT Goal #1: Pt will complete bed mobilty Min (A) as precursor to adls OT Goal: Miscellaneous Goal #1 - Progress: Progressing toward goals  Visit Information  Last OT Received On: 07/01/12 Assistance Needed: +2    Subjective Data      Prior Functioning       Cognition  Cognition Overall Cognitive Status: Impaired Area of Impairment: Attention;Memory Difficult to assess due to: Level of arousal Arousal/Alertness: Awake/alert Orientation Level: Appears intact for tasks assessed Behavior During Session: [facility] for tasks performed Current Attention Level: Sustained Memory Deficits: no recall of previous session. Pt inaccurately reporting OOB session with RN present.     Mobility  Bed Mobility Bed Mobility: Supine to Sit;Sitting - Scoot to [place] of Bed;Sit to Supine Rolling Right: 2: Max assist Rolling Left: 2: Max assist Supine to Sit: 1: +2 Total assist;HOB elevated Supine to Sit: Patient Percentage: 40% Sitting - Scoot to Edge of Bed: 2: Max assist (  with pad) Sit to Supine: 1: +2 Total assist;HOB flat Sit to Supine: Patient Percentage: 20% Details for Bed Mobility Assistance: Pt required pad use  to facilitate mobility.  Transfers Transfers: Stand to Sit;Sit to Stand Sit to Stand: Not tested (comment);With upper extremity assist;From bed Sit to Stand: Patient Percentage: 20% Stand to Sit: 1: +2 Total assist;With upper extremity assist;To bed Stand to Sit: Patient Percentage: 20% Details for Transfer Assistance: poor hand placment, grabbing environmental objects. Unable to maintain static standing with Rt LE TWB    Exercises      Balance Static Sitting Balance Static Sitting - Balance Support: Bilateral upper extremity supported;Feet supported Static Sitting - Level of Assistance: 4: Min assist   End of Session OT - End of Session Activity Tolerance: Patient limited by fatigue Patient left: in bed;with call bell/phone within reach Nurse Communication: Mobility status;Precautions;Need for lift equipment  GO     Lucile Shutters 07/01/2012, 4:14 PM Pager: [phone]

## 2012-07-01 NOTE — Progress Notes (Signed)
ANTICOAGULATION CONSULT NOTE - Initial Consult  Pharmacy Consult for coumadin Indication: atrial fibrillation  Allergies  Allergen Reactions  . Penicillins     REACTION: jittery    Patient Measurements: Height: 5\' 2"  (157.5 cm) Weight: 152 lb 8.9 oz (69.2 kg) IBW/kg (Calculated) : 50.1   Vital Signs: Temp: 97.8 F (36.6 C) (04/14 1600) Temp src: Oral (04/14 1600) BP: 112/50 mmHg (04/14 1313) Pulse Rate: 73 (04/14 1313)  Labs:  Recent Labs  06/29/12 0410 06/30/12 0500 07/01/12 0315  HGB 12.8  --   --   HCT 39.2  --   --   PLT 457*  --   --   CREATININE 0.72 0.66 0.66    Estimated Creatinine Clearance: 43.4 ml/min (by C-G formula based on Cr of 0.66).   Medical History: Past Medical History  Diagnosis Date  . S/P lumpectomy of breast 5-6 yrs ago    right breast ca  . Breast cancer, left breast diagnosed 2 yrs ago    no intervention  . Thyroid goiter   . GERD (gastroesophageal reflux disease)   . DM (diabetes mellitus)   . Hyperlipidemia   . Emphysema   . Cancer     lt breast ca/dx 2010/lumpectomy  . Macular degeneration, left eye   . Invasive lobular carcinoma of breast, stage 1 11/24/2010  . 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 COPD (chronic obstructive pulmonary disease) 11/24/2010  . S/P colonoscopy 10/2001    INCOMPLETE; limited to flex sig, 1 polyp removed  . S/P endoscopy 10/2001    erosive reflux esophagitis  . S/P endoscopy 12/2003    Dr Jena Gauss, erosive reflux esophagitis  . S/P colonoscopy 12/2003    non-compliant left colon, diverticulosis  . Schatzki's ring   . IBS (irritable bowel syndrome)   . Atrial fibrillation     on coumadin (Dr H[facility])  . Pancreatitis 12/2010  . Hiatal hernia     Medications:  Prescriptions prior to admission  Medication Sig Dispense Refill  . ALPRAZolam (XANAX) 0.5 MG tablet Take 0.5 mg by mouth 4 (four) times daily as needed for anxiety.       . AMITIZA 8 MCG capsule Take 8 mcg by mouth  Twice daily.       Marland Kitchen amLODipine (NORVASC) 5 MG tablet Take 5 mg by mouth daily.        . fish oil-omega-3 fatty acids 1000 MG capsule Take 1 g by mouth daily.       Marland Kitchen gemfibrozil (LOPID) 600 MG tablet Take 600 mg by mouth 2 (two) times daily before a meal.        . glipiZIDE (GLUCOTROL) 2.5 MG 24 hr tablet Take 2.5 mg by mouth daily.        Marland Kitchen lisinopril (PRINIVIL,ZESTRIL) 40 MG tablet Take 40 mg by mouth 2 (two) times daily.       . Multiple Vitamins-Minerals (EYE-VITE EXTRA PLUS LUTEIN) TABS Take 1 tablet by mouth daily.       . Nebulizer MISC by Does not apply route. prn       . pantoprazole (PROTONIX) 40 MG tablet Take 1 tablet (40 mg total) by mouth daily.  30 tablet  3  . trimethoprim (TRIMPEX) 100 MG tablet Take 100 mg by mouth every Monday, Wednesday, and Friday. Takes 1 tablet on Mondays, Wednesdays and Fridays      . warfarin (COUMADIN) 5 MG tablet Take 5 mg by mouth daily.  Scheduled:  . albuterol  2.5 mg Nebulization TID   And  . ipratropium  0.5 mg Nebulization TID  . antiseptic oral rinse  15 mL Mouth Rinse QID  . diltiazem  60 mg Oral Q8H  . enoxaparin (LOVENOX) injection  40 mg Subcutaneous Q24H  . feeding supplement  1 Container Oral TID BM  . hydrochlorothiazide  12.5 mg Oral Daily  . insulin aspart  0-5 Units Subcutaneous QHS  . insulin aspart  0-9 Units Subcutaneous TID WC  . methylPREDNISolone (SOLU-MEDROL) injection  125 mg Intravenous Q12H  . metoprolol tartrate  12.5 mg Oral Q6H  . pantoprazole  40 mg Oral Daily  . pantoprazole  40 mg Oral Q1200  . sodium chloride  10-40 mL Intracatheter Q12H  . [DISCONTINUED] digoxin  0.125 mg Intravenous Daily    Assessment: 79 yo female s/p sacro-iliac pinning on coumadin PTA for afib now to resume.  Last INR was 1.3 on 06/19/12 and INR at admission was at goal (2.24 on 06/17/12).  Last known coumadin dose (06/12/12) was 5mg /day except take 2.5mg  on Friday.  Goal of Therapy:  INR 2-3 Monitor platelets by  anticoagulation protocol: Yes   Plan:  -Coumadin 7.5mg  today -Daily PT/INR  Harland German, Pharm D 07/01/2012 5:28 PM

## 2012-07-01 NOTE — Progress Notes (Signed)
Patient has been transferred to [facility] via her bed. Family members present and followed staff to patient's new room. Phone report was called to [employer].

## 2012-07-02 LAB — GLUCOSE, CAPILLARY
Glucose-Capillary: 274 mg/dL — ABNORMAL HIGH (ref 70–99)
Glucose-Capillary: 243 mg/dL — ABNORMAL HIGH (ref 70–99)

## 2012-07-02 LAB — BASIC METABOLIC PANEL
BUN: 66 mg/dL — ABNORMAL HIGH (ref 6–23)
Chloride: 107 mEq/L (ref 96–112)
Creatinine, Ser: 0.62 mg/dL (ref 0.50–1.10)
GFR calc Af Amer: 90 mL/min — ABNORMAL LOW (ref >90)
GFR calc non Af Amer: 78 mL/min — ABNORMAL LOW (ref >90)

## 2012-07-02 MED ORDER — MORPHINE SULFATE 2 MG/ML IJ SOLN
2.0000 mg | INTRAMUSCULAR | Status: DC | PRN
  Administered 2012-07-03: 2 mg via INTRAVENOUS
  Filled 2012-07-02: qty 1

## 2012-07-02 MED ORDER — PREDNISONE 50 MG PO TABS
60.0000 mg | ORAL_TABLET | Freq: Every day | ORAL | Status: DC
  Administered 2012-07-03: 60 mg via ORAL
  Filled 2012-07-02 (×2): qty 1

## 2012-07-02 MED ORDER — OXYCODONE HCL 5 MG PO TABS
5.0000 mg | ORAL_TABLET | ORAL | Status: DC | PRN
  Administered 2012-07-02: 10 mg via ORAL
  Administered 2012-07-02: 5 mg via ORAL
  Administered 2012-07-02 – 2012-07-03 (×2): 10 mg via ORAL
  Filled 2012-07-02 (×3): qty 2
  Filled 2012-07-02: qty 1

## 2012-07-02 MED ORDER — WARFARIN SODIUM 7.5 MG PO TABS
7.5000 mg | ORAL_TABLET | Freq: Once | ORAL | Status: AC
  Administered 2012-07-02: 7.5 mg via ORAL
  Filled 2012-07-02 (×2): qty 1

## 2012-07-02 NOTE — Progress Notes (Signed)
Speech Language Pathology Dysphagia Treatment Patient Details Name: Sara Cohen MRN: [id number] DOB: [birth year] Today's Date: 07/02/2012 Time: 2130-8657 SLP Time Calculation (min): 33 min  Assessment / Plan / Recommendation Clinical Impression  Pt appears stronger and with decreased WOB today, as compared with initial contact with this SLP. Pt continues to exhibit congested cough. RN reports pt has significant fluid, which contributes to cough. Pt appears to be tolerating current diet of puree and nectar thick lqiuids.  ST to continue to follow for diet tolerance assessment and to determine readiness to advance diet/repeat objective study.    Diet Recommendation  Continue with Current Diet: Dysphagia 1 (puree);Nectar-thick liquid    SLP Plan Continue with current plan of care   Pertinent Vitals/Pain Pt reports discomfort in lower abdomen   Swallowing Goals  SLP Swallowing Goals Patient will utilize recommended strategies during swallow to increase swallowing safety with: Minimal assistance Swallow Study Goal #2 - Progress: Progressing toward goal  General Temperature Spikes Noted: No Respiratory Status: Supplemental O2 delivered via (comment) ([place]@5L ) Behavior/Cognition: Alert;Cooperative Patient Positioning: Upright in bed  Oral Cavity - Oral Hygiene Does patient have any of the following "at risk" factors?: Other - dysphagia;Oxygen therapy - cannula, mask, simple oxygen devices;Diet - patient on thickened liquids Brush patient's teeth BID with toothbrush (using toothpaste with fluoride): Yes Patient is AT RISK - Oral Care Protocol followed (see row info): Yes   Dysphagia Treatment Treatment focused on: Skilled observation of diet tolerance;Patient/family/caregiver education Family/Caregiver Educated: great granddaughter Treatment Methods/Modalities: Skilled observation;Differential diagnosis Patient observed directly with PO's: Yes Type of PO's observed: Nectar-thick  liquids Feeding: Able to feed self;Needs assist Pharyngeal Phase Signs & Symptoms:  (cough throughout, though noted prior to po intake) Type of cueing: Verbal Amount of cueing: Minimal   Rigdon Macomber B. Murvin Natal [facility], CCC-SLP [phone]  Leigh Aurora 07/02/2012, 10:48 AM

## 2012-07-02 NOTE — Progress Notes (Signed)
ANTICOAGULATION CONSULT NOTE  Pharmacy Consult for coumadin Indication: atrial fibrillation  Allergies  Allergen Reactions  . Penicillins     REACTION: jittery    Patient Measurements: Height: 5\' 2"  (157.5 cm) Weight: 152 lb 8.9 oz (69.2 kg) IBW/kg (Calculated) : 50.1   Vital Signs: Temp: 97.9 F (36.6 C) (04/15 0518) Temp src: Oral (04/15 0518) BP: 138/70 mmHg (04/15 0518) Pulse Rate: 75 (04/15 0518)  Labs:  Recent Labs  06/30/12 0500 07/01/12 0315 07/02/12 0500  LABPROT  --   --  15.3*  INR  --   --  1.23  CREATININE 0.66 0.66 0.62    Estimated Creatinine Clearance: 43.4 ml/min (by C-G formula based on Cr of 0.62).   Scheduled:  . albuterol  2.5 mg Nebulization TID   And  . ipratropium  0.5 mg Nebulization TID  . antiseptic oral rinse  15 mL Mouth Rinse QID  . diltiazem  60 mg Oral Q8H  . enoxaparin (LOVENOX) injection  40 mg Subcutaneous Q24H  . feeding supplement  1 Container Oral TID BM  . hydrochlorothiazide  12.5 mg Oral Daily  . insulin aspart  0-5 Units Subcutaneous QHS  . insulin aspart  0-9 Units Subcutaneous TID WC  . methylPREDNISolone (SOLU-MEDROL) injection  125 mg Intravenous Q12H  . metoprolol tartrate  12.5 mg Oral Q6H  . pantoprazole  40 mg Oral Daily  . pantoprazole  40 mg Oral Q1200  . sodium chloride  10-40 mL Intracatheter Q12H  . [COMPLETED] warfarin  7.5 mg Oral ONCE-1800  . Warfarin - Pharmacist Dosing Inpatient   Does not apply q1800  . [DISCONTINUED] digoxin  0.125 mg Intravenous Daily    Assessment: 79 yo female s/p sacro-iliac pinning on coumadin PTA for afib now to resume.  INR today is 1.23 and noted INR at admission was at goal (2.24 on 06/17/12).  Last known coumadin dose (06/12/12) was 5mg /day except take 2.5mg  on Friday.  Goal of Therapy:  INR 2-3 Monitor platelets by anticoagulation protocol: Yes   Plan:  -Coumadin 7.5mg  today -Daily PT/INR  Harland German, Pharm D 07/02/2012 9:15 AM

## 2012-07-02 NOTE — Progress Notes (Signed)
Bed offers given to son.  Appreciate cardiology F/U. Patient examined and I agree with the assessment and plan  Violeta Gelinas, MD, MPH, FACS Pager: [phone]  07/02/2012 1:57 PM

## 2012-07-02 NOTE — Progress Notes (Signed)
Rehab admission - Evaluated for possible admission.  Please see rehab consult done by Dr. Wynn Banker recommending SNF.  Agree with need for SNF.  Likely would not be able to tolerate an intensive inpatient rehab program.  Anticipate that it will take her a prolonged period of recovery time.  Call me for questions.  #[phone]
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
## 2012-07-03 ENCOUNTER — Other Ambulatory Visit: Payer: Self-pay | Admitting: Geriatric Medicine

## 2012-07-03 ENCOUNTER — Inpatient Hospital Stay
Admission: RE | Admit: 2012-07-03 | Discharge: 2012-07-04 | Disposition: A | Discharge status: Nursing Home (Includes ICF) | Payer: Medicare Other | Source: Ambulatory Visit | Attending: Internal Medicine | Admitting: Internal Medicine

## 2012-07-03 LAB — PROTIME-INR
INR: 1.77 — ABNORMAL HIGH (ref 0.00–1.49)
Prothrombin Time: 20 s — ABNORMAL HIGH (ref 11.6–15.2)

## 2012-07-03 LAB — BASIC METABOLIC PANEL
CO2: 34 mEq/L — ABNORMAL HIGH (ref 19–32)
Chloride: 110 mEq/L (ref 96–112)
Glucose, Bld: 157 mg/dL — ABNORMAL HIGH (ref 70–99)
Sodium: 150 mEq/L — ABNORMAL HIGH (ref 135–145)

## 2012-07-03 MED ORDER — INSULIN ASPART 100 UNIT/ML ~~LOC~~ SOLN: 0.0000 [IU] | Freq: Every day | SUBCUTANEOUS | Status: DC

## 2012-07-03 MED ORDER — HYDROCHLOROTHIAZIDE 12.5 MG PO CAPS: 12.5000 mg | ORAL_CAPSULE | Freq: Every day | ORAL | Status: AC

## 2012-07-03 MED ORDER — IPRATROPIUM BROMIDE 0.02 % IN SOLN: 0.5000 mg | Freq: Three times a day (TID) | RESPIRATORY_TRACT | Status: AC

## 2012-07-03 MED ORDER — ALBUTEROL SULFATE (5 MG/ML) 0.5% IN NEBU: 2.5000 mg | INHALATION_SOLUTION | Freq: Three times a day (TID) | RESPIRATORY_TRACT | Status: DC

## 2012-07-03 MED ORDER — INSULIN ASPART 100 UNIT/ML ~~LOC~~ SOLN: 0.0000 [IU] | Freq: Three times a day (TID) | SUBCUTANEOUS | Status: DC

## 2012-07-03 MED ORDER — ALPRAZOLAM 0.25 MG PO TABS: 0.2500 mg | ORAL_TABLET | Freq: Three times a day (TID) | ORAL | Status: AC | PRN

## 2012-07-03 MED ORDER — ENSURE PUDDING PO PUDG: 1.0000 | Freq: Three times a day (TID) | ORAL | Status: AC

## 2012-07-03 MED ORDER — ALBUTEROL SULFATE (5 MG/ML) 0.5% IN NEBU: 2.5000 mg | INHALATION_SOLUTION | Freq: Four times a day (QID) | RESPIRATORY_TRACT | Status: AC | PRN

## 2012-07-03 MED ORDER — OXYCODONE HCL 5 MG PO TABS: 5.0000 mg | ORAL_TABLET | ORAL | Status: DC | PRN

## 2012-07-03 MED ORDER — ALPRAZOLAM 0.25 MG PO TABS: 0.2500 mg | ORAL_TABLET | Freq: Three times a day (TID) | ORAL | Status: DC | PRN

## 2012-07-03 MED ORDER — ENOXAPARIN SODIUM 40 MG/0.4ML ~~LOC~~ SOLN: 40.0000 mg | SUBCUTANEOUS | Status: AC

## 2012-07-03 MED ORDER — DILTIAZEM HCL 60 MG PO TABS: 60.0000 mg | ORAL_TABLET | Freq: Three times a day (TID) | ORAL | Status: AC

## 2012-07-03 MED ORDER — METOPROLOL TARTRATE 12.5 MG HALF TABLET: 12.5000 mg | ORAL_TABLET | Freq: Four times a day (QID) | ORAL | Status: AC

## 2012-07-03 MED ORDER — PREDNISONE 20 MG PO TABS: 60.0000 mg | ORAL_TABLET | Freq: Every day | ORAL | Status: AC
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
## 2012-07-03 NOTE — Clinical Social Work Note (Signed)
Clinical Social Worker facilitated patient discharge including contacting patient family and facility to confirm discharge plans.  Patient son, Sara Cohen is agreeable to placement at the [facility] and patient agreeable to what son says.  CSW spoke with facility and will arrange ambulance transport to [facility] today.  RN to call report prior to discharge.    Clinical Social Worker will sign off for now as social work intervention is no longer needed. Please consult [place] again if new need arises.  Macario Golds, [place] [phone]

## 2012-07-03 NOTE — Progress Notes (Signed)
Nursing Note: Provided care for 4 hours then reported off to on-coming nurse.wbb

## 2012-07-03 NOTE — Discharge Summary (Signed)
Sara Basquez, MD, MPH, FACS Pager: [phone]  
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
## 2012-07-03 NOTE — Progress Notes (Signed)
I also D/W Dr. Rennis Golden, her outpatient cardiologist Violeta Gelinas, MD, MPH, FACS Pager: [phone]
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
## 2012-07-04 ENCOUNTER — Emergency Department (HOSPITAL_COMMUNITY): Payer: Medicare Other

## 2012-07-04 ENCOUNTER — Encounter (HOSPITAL_COMMUNITY): Payer: Self-pay | Admitting: *Deleted

## 2012-07-04 ENCOUNTER — Inpatient Hospital Stay (HOSPITAL_COMMUNITY)
Admission: EM | Admit: 2012-07-04 | Discharge: 2012-07-18 | DRG: 871 | Disposition: E | Payer: Medicare Other | Attending: General Surgery | Admitting: General Surgery

## 2012-07-04 DIAGNOSIS — Z66 Do not resuscitate: Secondary | ICD-10-CM | POA: Diagnosis present

## 2012-07-04 DIAGNOSIS — L8992 Pressure ulcer of unspecified site, stage 2: Secondary | ICD-10-CM | POA: Diagnosis present

## 2012-07-04 DIAGNOSIS — Z515 Encounter for palliative care: Secondary | ICD-10-CM

## 2012-07-04 DIAGNOSIS — J95821 Acute postprocedural respiratory failure: Secondary | ICD-10-CM

## 2012-07-04 DIAGNOSIS — E785 Hyperlipidemia, unspecified: Secondary | ICD-10-CM | POA: Diagnosis present

## 2012-07-04 DIAGNOSIS — Z880 Allergy status to penicillin: Secondary | ICD-10-CM

## 2012-07-04 DIAGNOSIS — R6520 Severe sepsis without septic shock: Secondary | ICD-10-CM | POA: Diagnosis present

## 2012-07-04 DIAGNOSIS — L89109 Pressure ulcer of unspecified part of back, unspecified stage: Secondary | ICD-10-CM | POA: Diagnosis present

## 2012-07-04 DIAGNOSIS — E049 Nontoxic goiter, unspecified: Secondary | ICD-10-CM | POA: Diagnosis present

## 2012-07-04 DIAGNOSIS — D62 Acute posthemorrhagic anemia: Secondary | ICD-10-CM | POA: Diagnosis present

## 2012-07-04 DIAGNOSIS — K589 Irritable bowel syndrome without diarrhea: Secondary | ICD-10-CM | POA: Diagnosis present

## 2012-07-04 DIAGNOSIS — A419 Sepsis, unspecified organism: Secondary | ICD-10-CM

## 2012-07-04 DIAGNOSIS — Z853 Personal history of malignant neoplasm of breast: Secondary | ICD-10-CM

## 2012-07-04 DIAGNOSIS — F17200 Nicotine dependence, unspecified, uncomplicated: Secondary | ICD-10-CM | POA: Diagnosis present

## 2012-07-04 DIAGNOSIS — R6521 Severe sepsis with septic shock: Secondary | ICD-10-CM

## 2012-07-04 DIAGNOSIS — G9340 Encephalopathy, unspecified: Secondary | ICD-10-CM | POA: Diagnosis present

## 2012-07-04 DIAGNOSIS — J9600 Acute respiratory failure, unspecified whether with hypoxia or hypercapnia: Secondary | ICD-10-CM | POA: Diagnosis present

## 2012-07-04 DIAGNOSIS — Z794 Long term (current) use of insulin: Secondary | ICD-10-CM

## 2012-07-04 DIAGNOSIS — J438 Other emphysema: Secondary | ICD-10-CM | POA: Diagnosis present

## 2012-07-04 DIAGNOSIS — K219 Gastro-esophageal reflux disease without esophagitis: Secondary | ICD-10-CM | POA: Diagnosis present

## 2012-07-04 DIAGNOSIS — N390 Urinary tract infection, site not specified: Secondary | ICD-10-CM

## 2012-07-04 DIAGNOSIS — R4182 Altered mental status, unspecified: Secondary | ICD-10-CM | POA: Diagnosis present

## 2012-07-04 DIAGNOSIS — Z7901 Long term (current) use of anticoagulants: Secondary | ICD-10-CM

## 2012-07-04 DIAGNOSIS — E119 Type 2 diabetes mellitus without complications: Secondary | ICD-10-CM | POA: Diagnosis present

## 2012-07-04 DIAGNOSIS — I4891 Unspecified atrial fibrillation: Secondary | ICD-10-CM | POA: Diagnosis present

## 2012-07-04 DIAGNOSIS — Z79899 Other long term (current) drug therapy: Secondary | ICD-10-CM

## 2012-07-04 DIAGNOSIS — J811 Chronic pulmonary edema: Secondary | ICD-10-CM

## 2012-07-04 DIAGNOSIS — J189 Pneumonia, unspecified organism: Secondary | ICD-10-CM | POA: Diagnosis present

## 2012-07-04 HISTORY — DX: Fracture of one rib, unspecified side, initial encounter for closed fracture: S22.39XA

## 2012-07-04 HISTORY — DX: Unspecified fracture of sacrum, initial encounter for closed fracture: S32.10XA

## 2012-07-04 HISTORY — DX: Multiple fractures of ribs, unspecified side, initial encounter for closed fracture: S22.49XA

## 2012-07-04 HISTORY — DX: Fracture of unspecified parts of lumbosacral spine and pelvis, initial encounter for closed fracture: S32.9XXA

## 2012-07-04 LAB — URINALYSIS, ROUTINE W REFLEX MICROSCOPIC
Ketones, ur: NEGATIVE mg/dL
Specific Gravity, Urine: 1.015 (ref 1.005–1.030)
Urobilinogen, UA: 1 mg/dL (ref 0.0–1.0)

## 2012-07-04 LAB — BLOOD GAS, ARTERIAL
Acid-Base Excess: 5.5 mmol/L — ABNORMAL HIGH (ref 0.0–2.0)
Bicarbonate: 30.9 mEq/L — ABNORMAL HIGH (ref 20.0–24.0)
Delivery systems: POSITIVE
FIO2: 100 %
O2 Content: 100 L/min
O2 Saturation: 93.7 %
Patient temperature: 37
RATE: 12 resp/min
TCO2: 28.4 mmol/L (ref 0–100)

## 2012-07-04 LAB — BASIC METABOLIC PANEL
BUN: 81 mg/dL — ABNORMAL HIGH (ref 6–23)
Calcium: 9.2 mg/dL (ref 8.4–10.5)
Creatinine, Ser: 0.99 mg/dL (ref 0.50–1.10)
GFR calc non Af Amer: 49 mL/min — ABNORMAL LOW (ref >90)
Glucose, Bld: 79 mg/dL (ref 70–99)
Potassium: 3.9 mEq/L (ref 3.5–5.1)

## 2012-07-04 LAB — CBC WITH DIFFERENTIAL/PLATELET
Basophils Absolute: 0.1 10*3/uL (ref 0.0–0.1)
Basophils Relative: 0 % (ref 0–1)
Eosinophils Absolute: 0 10*3/uL (ref 0.0–0.7)
Eosinophils Relative: 0 % (ref 0–5)
HCT: 36.3 % (ref 36.0–46.0)
Hemoglobin: 12 g/dL (ref 12.0–15.0)
MCH: 29.7 pg (ref 26.0–34.0)
MCHC: 33.1 g/dL (ref 30.0–36.0)
MCV: 89.9 fL (ref 78.0–100.0)
Monocytes Absolute: 1.9 10*3/uL — ABNORMAL HIGH (ref 0.1–1.0)
Monocytes Relative: 5 % (ref 3–12)
RDW: 15.4 % (ref 11.5–15.5)

## 2012-07-04 LAB — HEPATIC FUNCTION PANEL
ALT: 62 U/L — ABNORMAL HIGH (ref 0–35)
AST: 21 U/L (ref 0–37)
Alkaline Phosphatase: 127 U/L — ABNORMAL HIGH (ref 39–117)
Bilirubin, Direct: 0.4 mg/dL — ABNORMAL HIGH (ref 0.0–0.3)

## 2012-07-04 LAB — GLUCOSE, CAPILLARY: Glucose-Capillary: 90 mg/dL (ref 70–99)

## 2012-07-04 LAB — PROTIME-INR: INR: 4.2 — ABNORMAL HIGH (ref 0.00–1.49)

## 2012-07-04 LAB — URINE MICROSCOPIC-ADD ON

## 2012-07-04 LAB — PRO B NATRIURETIC PEPTIDE: Pro B Natriuretic peptide (BNP): 7452 pg/mL — ABNORMAL HIGH (ref 0–450)

## 2012-07-04 MED ORDER — DEXTROSE 50 % IV SOLN
25.0000 mL | Freq: Once | INTRAVENOUS | Status: AC | PRN
  Administered 2012-07-04: 25 mL via INTRAVENOUS

## 2012-07-04 MED ORDER — VANCOMYCIN HCL IN DEXTROSE 1-5 GM/200ML-% IV SOLN
1000.0000 mg | Freq: Once | INTRAVENOUS | Status: AC
  Administered 2012-07-04: 1000 mg via INTRAVENOUS
  Filled 2012-07-04: qty 200

## 2012-07-04 MED ORDER — INSULIN ASPART 100 UNIT/ML ~~LOC~~ SOLN: 0.0000 [IU] | SUBCUTANEOUS | Status: DC

## 2012-07-04 MED ORDER — PANTOPRAZOLE SODIUM 40 MG IV SOLR
40.0000 mg | Freq: Every day | INTRAVENOUS | Status: DC
  Administered 2012-07-04: 40 mg via INTRAVENOUS
  Filled 2012-07-04 (×4): qty 40

## 2012-07-04 MED ORDER — LEVOFLOXACIN IN D5W 750 MG/150ML IV SOLN
750.0000 mg | INTRAVENOUS | Status: DC
  Administered 2012-07-04: 750 mg via INTRAVENOUS
  Filled 2012-07-04: qty 150

## 2012-07-04 MED ORDER — SODIUM CHLORIDE 0.9 % IV BOLUS (SEPSIS): 1000.0000 mL | Freq: Once | INTRAVENOUS | Status: DC

## 2012-07-04 MED ORDER — DEXTROSE 50 % IV SOLN
INTRAVENOUS | Status: AC
  Filled 2012-07-04: qty 50

## 2012-07-04 MED ORDER — PANTOPRAZOLE SODIUM 40 MG PO TBEC: 40.0000 mg | DELAYED_RELEASE_TABLET | Freq: Every day | ORAL | Status: DC

## 2012-07-04 MED ORDER — KCL IN DEXTROSE-NACL 20-5-0.45 MEQ/L-%-% IV SOLN
INTRAVENOUS | Status: DC
  Administered 2012-07-04: 22:00:00 via INTRAVENOUS
  Filled 2012-07-04 (×2): qty 1000

## 2012-07-04 MED ORDER — MORPHINE SULFATE 2 MG/ML IJ SOLN
2.0000 mg | INTRAMUSCULAR | Status: DC | PRN
  Administered 2012-07-04 (×5): 2 mg via INTRAVENOUS
  Filled 2012-07-04 (×5): qty 1

## 2012-07-04 MED ORDER — FUROSEMIDE 10 MG/ML IJ SOLN
80.0000 mg | Freq: Once | INTRAMUSCULAR | Status: AC
  Administered 2012-07-04: 80 mg via INTRAVENOUS
  Filled 2012-07-04: qty 8

## 2012-07-04 MED ORDER — SODIUM CHLORIDE 0.9 % IV SOLN: INTRAVENOUS | Status: DC

## 2012-07-04 MED ORDER — ONDANSETRON HCL 4 MG/2ML IJ SOLN: 4.0000 mg | Freq: Four times a day (QID) | INTRAMUSCULAR | Status: DC | PRN

## 2012-07-04 MED ORDER — SODIUM CHLORIDE 0.9 % IV SOLN
INTRAVENOUS | Status: DC
  Administered 2012-07-04: 11:00:00 via INTRAVENOUS

## 2012-07-04 MED ORDER — ONDANSETRON HCL 4 MG PO TABS: 4.0000 mg | ORAL_TABLET | Freq: Four times a day (QID) | ORAL | Status: DC | PRN

## 2012-07-04 MED ORDER — LEVOFLOXACIN IN D5W 750 MG/150ML IV SOLN: 750.0000 mg | INTRAVENOUS | Status: DC

## 2012-07-04 MED ORDER — VANCOMYCIN HCL IN DEXTROSE 1-5 GM/200ML-% IV SOLN
1000.0000 mg | INTRAVENOUS | Status: DC
  Filled 2012-07-04: qty 200

## 2012-07-04 MED ORDER — DEXTROSE 5 % IV SOLN
2.0000 g | Freq: Three times a day (TID) | INTRAVENOUS | Status: DC
  Administered 2012-07-04 – 2012-07-05 (×3): 2 g via INTRAVENOUS
  Filled 2012-07-04 (×7): qty 2

## 2012-07-04 MED ORDER — SODIUM CHLORIDE 0.9 % IV SOLN
Freq: Once | INTRAVENOUS | Status: AC
  Administered 2012-07-04: 08:00:00 via INTRAVENOUS

## 2012-07-04 MED ORDER — POTASSIUM CHLORIDE IN NACL 20-0.45 MEQ/L-% IV SOLN
INTRAVENOUS | Status: DC
  Administered 2012-07-04: 14:00:00 via INTRAVENOUS
  Filled 2012-07-04 (×2): qty 1000

## 2012-07-04 MED ORDER — ALBUMIN HUMAN 5 % IV SOLN
12.5000 g | Freq: Once | INTRAVENOUS | Status: AC
  Administered 2012-07-04: 12.5 g via INTRAVENOUS
  Filled 2012-07-04: qty 250

## 2012-07-04 NOTE — Progress Notes (Signed)
Hypoglycemic Event  CBG: 60  Treatment: D50 IV 25 mL  Symptoms: None  Follow-up CBG: Time:2125 CBG Result:90  Possible Reasons for Event: Inadequate meal intake and Change in activity  Comments/MD notified:Dr. Janee Morn aware of hypoglycemic episodes, order to change fluids to include D5    Sara Cohen, Augustina Mood  Remember to initiate Hypoglycemia Order Set & complete

## 2012-07-04 NOTE — ED Notes (Signed)
Beeped Hospitalist again and also had operator to beep Dr. Lovell Sheehan.

## 2012-07-04 NOTE — ED Notes (Signed)
Pt arrived with foley in place from [facility].
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
## 2012-07-04 NOTE — ED Notes (Signed)
EDP at bedside to speak with son,daughter and granddaughter about plan of care and pt's wishes.

## 2012-07-04 NOTE — Progress Notes (Signed)
Update called to Dr. Janee Morn
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
## 2012-07-04 NOTE — ED Notes (Addendum)
Pt refusing to be transfered to [facility] at this time. EDP and RN at bedside to speak with family and pt about benefits and risks of transfer. RT at bedside to adjust bi-pap.
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
## 2012-07-05 ENCOUNTER — Inpatient Hospital Stay (HOSPITAL_COMMUNITY): Payer: Medicare Other

## 2012-07-05 ENCOUNTER — Telehealth (HOSPITAL_COMMUNITY): Payer: Self-pay | Admitting: Emergency Medicine

## 2012-07-05 ENCOUNTER — Encounter (HOSPITAL_COMMUNITY): Payer: Self-pay | Admitting: *Deleted

## 2012-07-05 LAB — GLUCOSE, CAPILLARY
Glucose-Capillary: 110 mg/dL — ABNORMAL HIGH (ref 70–99)
Glucose-Capillary: 118 mg/dL — ABNORMAL HIGH (ref 70–99)
Glucose-Capillary: 86 mg/dL (ref 70–99)

## 2012-07-05 LAB — BASIC METABOLIC PANEL
BUN: 85 mg/dL — ABNORMAL HIGH (ref 6–23)
CO2: 28 mEq/L (ref 19–32)
Calcium: 8.8 mg/dL (ref 8.4–10.5)
Creatinine, Ser: 1.63 mg/dL — ABNORMAL HIGH (ref 0.50–1.10)
Glucose, Bld: 80 mg/dL (ref 70–99)

## 2012-07-05 LAB — CBC
HCT: 35.9 % — ABNORMAL LOW (ref 36.0–46.0)
Hemoglobin: 11.4 g/dL — ABNORMAL LOW (ref 12.0–15.0)
MCH: 28.6 pg (ref 26.0–34.0)
MCHC: 31.8 g/dL (ref 30.0–36.0)
MCV: 90 fL (ref 78.0–100.0)
RBC: 3.99 MIL/uL (ref 3.87–5.11)

## 2012-07-05 LAB — PROTIME-INR: INR: 6.11 (ref 0.00–1.49)

## 2012-07-05 MED ORDER — SODIUM CHLORIDE 0.9 % IV SOLN
10.0000 mg/h | INTRAVENOUS | Status: DC
  Administered 2012-07-05: 10 mg/h via INTRAVENOUS
  Filled 2012-07-05: qty 10

## 2012-07-05 MED ORDER — LORAZEPAM 2 MG/ML IJ SOLN: 2.0000 mg | Freq: Once | INTRAMUSCULAR | Status: AC

## 2012-07-05 MED ORDER — LORAZEPAM 2 MG/ML IJ SOLN
INTRAMUSCULAR | Status: AC
  Administered 2012-07-05: 2 mg via INTRAVENOUS
  Filled 2012-07-05: qty 1

## 2012-07-06 LAB — URINE CULTURE

## 2012-07-07 ENCOUNTER — Telehealth (HOSPITAL_COMMUNITY): Payer: Self-pay | Admitting: Emergency Medicine

## 2012-07-07 LAB — CULTURE, BLOOD (ROUTINE X 2)

## 2012-07-18 NOTE — Progress Notes (Signed)
Subjective: Pt in multisystem organ failure with no UOP and INR 6, SBP 63, requiring bipap.  Objective: Vital signs in last 24 hours: Temp:  [97 F (36.1 C)-97.9 F (36.6 C)] 97.9 F (36.6 C) (04/18 0721) Pulse Rate:  [81-110] 81 (04/18 0700) Resp:  [18-32] 19 (04/18 0700) BP: (56-141)/(27-109) 73/30 mmHg (04/18 0700) SpO2:  [87 %-100 %] 94 % (04/18 0700) FiO2 (%):  [100 %] 100 % (04/18 0600) Weight:  [160 lb 15 oz (73 kg)-161 lb 2.5 oz (73.1 kg)] 161 lb 2.5 oz (73.1 kg) (04/18 0500) Last BM Date: 07/07/2012  Intake/Output from previous day: 04/17 0701 - 04/18 0700 In: 1552.3 [I.V.:1152.3; IV Piggyback:400] Out: 155 [Urine:155] Intake/Output this shift:    General appearance: minimal responsiveness with inapprop motor to touch.  no response to voice Resp: labored breathing. Cardio: regular rate and rhythm GI: soft Neurologic: Sensory: moves arm to touch Motor: no purposeful movement  Lab Results:   Recent Labs  06/26/2012 0803 07/12/2012 0400  WBC 34.5* 17.8*  HGB 12.0 11.4*  HCT 36.3 35.9*  PLT 245 232   BMET  Recent Labs  07/07/2012 0803 07/14/2012 0400  NA 142 144  K 3.9 5.3*  CL 104 106  CO2 30 28  GLUCOSE 79 80  BUN 81* 85*  CREATININE 0.99 1.63*  CALCIUM 9.2 8.8   PT/INR  Recent Labs  06/24/2012 0845 07/09/2012 0400  LABPROT 37.9* 50.1*  INR 4.20* 6.11*   ABG  Recent Labs  07/17/2012 0905  PHART 7.346*  HCO3 30.9*    Studies/Results: Dg Chest Port 1 View  07/14/2012  *RADIOLOGY REPORT*  Clinical Data: Pulmonary edema with effusions  PORTABLE CHEST - 1 VIEW  Comparison: Portable chest x-ray of 07/10/2012  Findings: There is slightly more opacity at the right lung base with patchy airspace disease and effusions remaining.  This pattern may represent edema although pneumonia cannot be excluded. Cardiomegaly is stable. No bony abnormality is seen.  IMPRESSION: Little change in patchy some worsening of airspace disease at the right lung base.   Possible edema with effusions, but cannot exclude pneumonia.   Original Report Authenticated By: Dwyane Dee, M.D.    Dg Chest Portable 1 View  07/16/2012  *RADIOLOGY REPORT*  Clinical Data: Dyspnea.  Hypotension.  PORTABLE CHEST - 1 VIEW  Comparison: 07/01/2012 and 06/26/2012.  Findings: 0800 hours.  Right arm PICC has been removed.  Heart size and mediastinal contours are stable.  There is aortic atherosclerosis.  A calcification in the right paratracheal region at the thoracic inlet is unchanged, thyroid in origin.  There is increased right perihilar opacity, likely atelectasis.  Edema, bilateral pleural effusions and bibasilar opacities are unchanged. There is no pneumothorax. Rib fractures are again noted on the right.  IMPRESSION: Persistent pulmonary edema with bilateral pleural effusions. Increased right perihilar opacity probably reflects atelectasis, although pneumonia is not excluded.   Original Report Authenticated By: Carey Bullocks, M.D.     Anti-infectives: Anti-infectives   Start     Dose/Rate Route Frequency Ordered Stop   07-18-2012 0900  levofloxacin (LEVAQUIN) IVPB 750 mg     750 mg 100 mL/hr over 90 Minutes Intravenous Every 48 hours 06/22/2012 1329      1000  vancomycin (VANCOCIN) IVPB 1000 mg/200 mL premix     1,000 mg 200 mL/hr over 60 Minutes Intravenous Every 24 hours 06/22/2012 1330      1100  vancomycin (VANCOCIN) IVPB 1000 mg/200 mL premix     1,000 mg 200  mL/hr over 60 Minutes Intravenous  Once 07/10/2012 0928 06/24/2012 1201   07/03/2012 1000  aztreonam (AZACTAM) 2 g in dextrose 5 % 50 mL IVPB     2 g 100 mL/hr over 30 Minutes Intravenous 3 times per day 07/03/2012 0858 07/12/12 1359   06/19/2012 0900  levofloxacin (LEVAQUIN) IVPB 750 mg  Status:  Discontinued     750 mg 100 mL/hr over 90 Minutes Intravenous Every 24 hours 06/23/2012 0858 06/18/2012 1329      Assessment/Plan: s/p * No surgery found * Pt in multisystem organ failure.   Pt and family wish DNR  which is appropriate given her level of injury and age. Plan comfort care only Morphine gtt. D/C life sustaining measures. Plan d/c bipap after morphine initiated.   LOS: 1 day    [facility] 07/17/2012

## 2012-07-18 NOTE — Progress Notes (Signed)
BiPAP mask was removed off of patient's face and therapy discontinued.
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
## 2012-07-18 NOTE — Progress Notes (Signed)
CRITICAL VALUE ALERT  Critical value received: INR 6.11,  Gram neg rods on blood culture  Date of notification:  07/09/2012  Time of notification:  0615  Critical value read back:yes  Nurse who received alert:  Burna Cash  MD notified (1st page):  Dr. Janee Morn  Time of first page:  0615  MD notified (2nd page):  Time of second page:  Responding MD:    Time MD responded:

## 2012-07-18 NOTE — Progress Notes (Signed)
UR completed 

## 2012-07-18 NOTE — Progress Notes (Signed)
Time of death 1200pm 07/11/2012 Verified by 2 RNs at the bedside, no breath/heart sounds auscultated for 1 min.  Dr. Donell Beers (primary trauma service) notified of time of death/ pt expiration.  Family at bedside.
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
## 2012-07-18 DEATH — deceased

## 2012-08-05 ENCOUNTER — Ambulatory Visit: Payer: Medicare Other | Admitting: Internal Medicine

## 2012-09-12 ENCOUNTER — Ambulatory Visit: Payer: Medicare Other | Admitting: Gastroenterology

## 2012-12-03 ENCOUNTER — Ambulatory Visit: Payer: Self-pay | Admitting: Pharmacist Clinician (PhC)/ Clinical Pharmacy Specialist

## 2012-12-03 DIAGNOSIS — Z7901 Long term (current) use of anticoagulants: Secondary | ICD-10-CM

## 2012-12-03 DIAGNOSIS — I480 Paroxysmal atrial fibrillation: Secondary | ICD-10-CM

## 2013-03-19 IMAGING — CT CT ABD-PELV W/ CM
2 of 4 series · 13 of 36 positions shown, 16 images · IV contrast (Omnipaque 300)
Comparison: CT abdomen pelvis dated 01/04/2010

CT CHEST

CLINICAL DATA: Breast cancer, abdominal pain, weight loss, status
post hysterectomy, cholecystectomy, and appendectomy

CT CHEST, ABDOMEN AND PELVIS WITH CONTRAST
TECHNIQUE: Multidetector CT imaging of the chest, abdomen and
pelvis was performed following the standard protocol during bolus
administration of intravenous contrast.
Contrast: 100 ml Umnipaque-AKK IV

[Series 2: cap with 5.0 b40f · axial · 0.67mm/px · z∈[-576,-42]mm · 10 of 119 slices shown, 13 images]
[im 6/119  mediastinal]
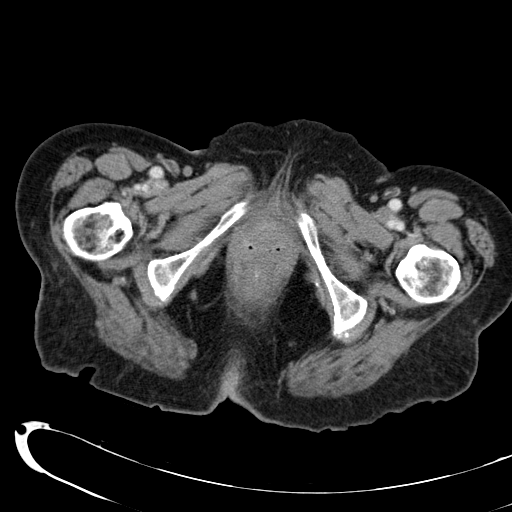
[im 6/119  lung]
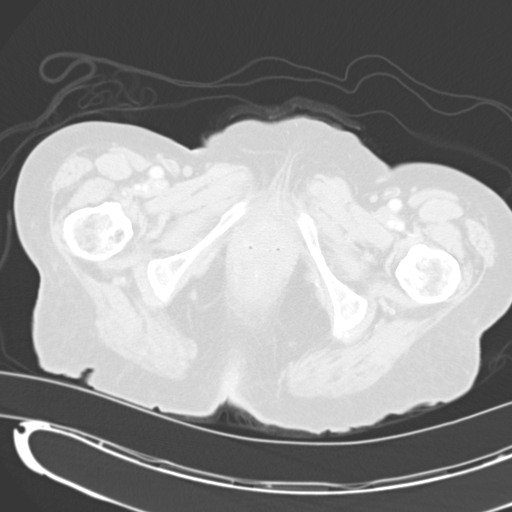
[im 18/119  lung]
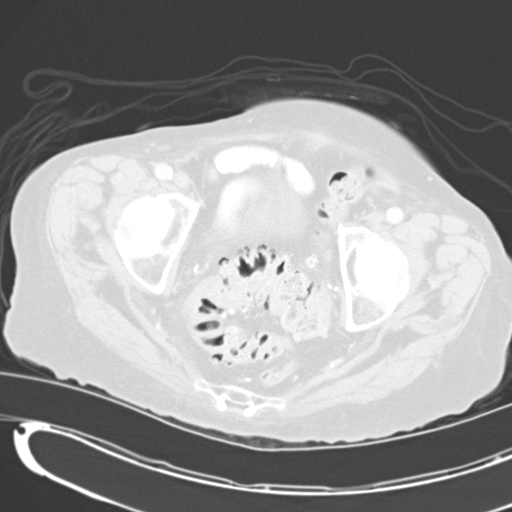
[im 30/119  lung]
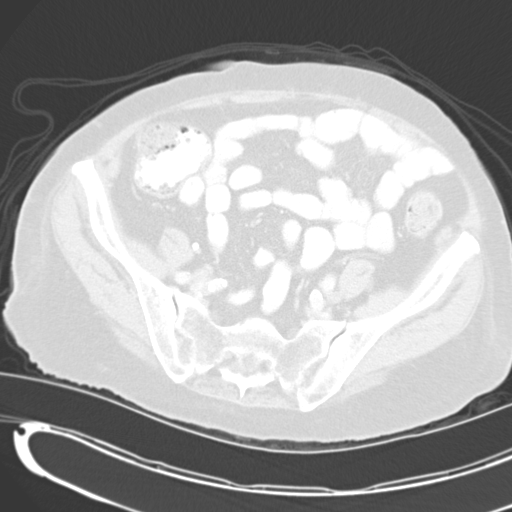
[im 42/119  lung]
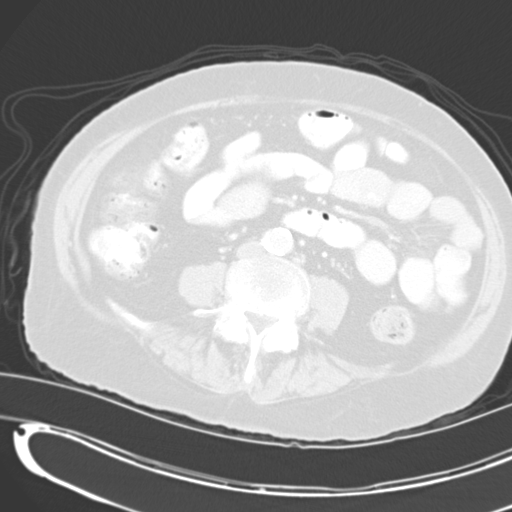
[im 54/119  mediastinal]
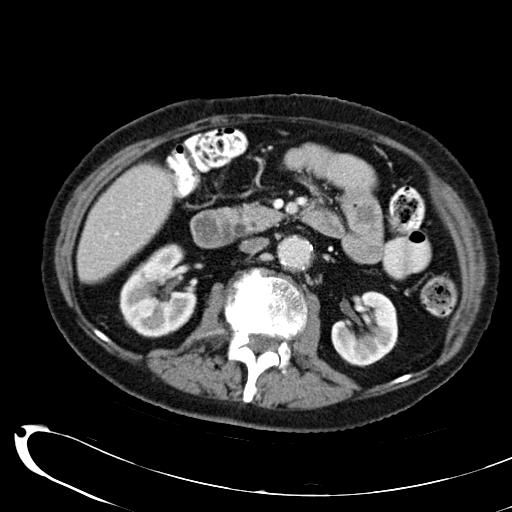
[im 54/119  lung]
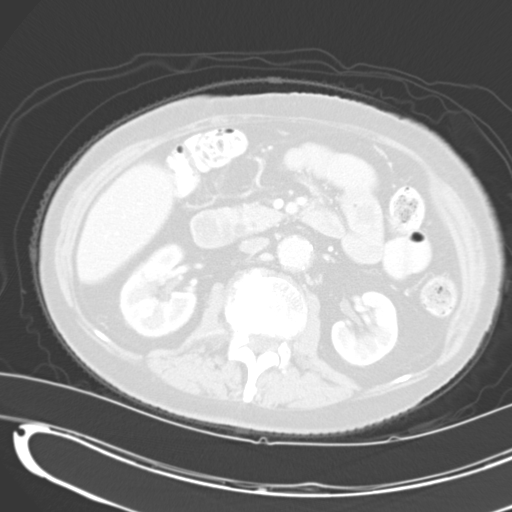
[im 65/119  lung]
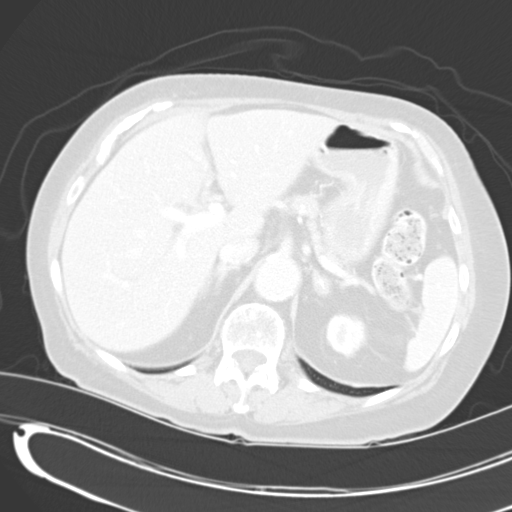
[im 77/119  lung]
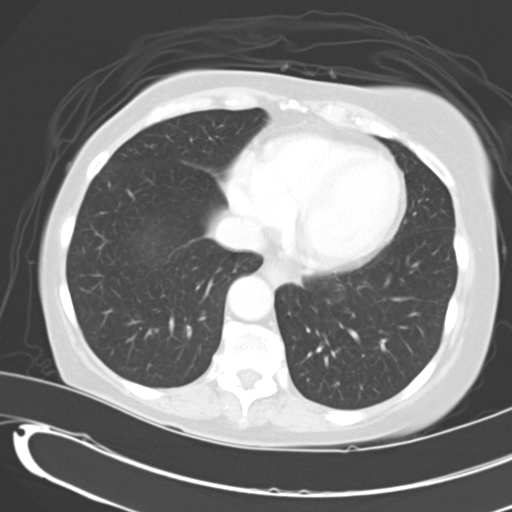
[im 89/119  lung]
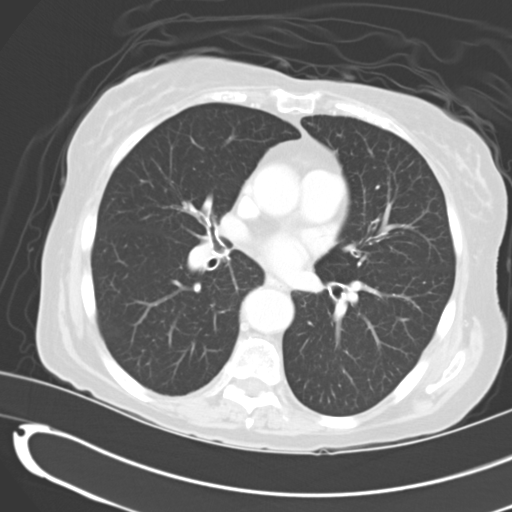
[im 101/119  mediastinal]
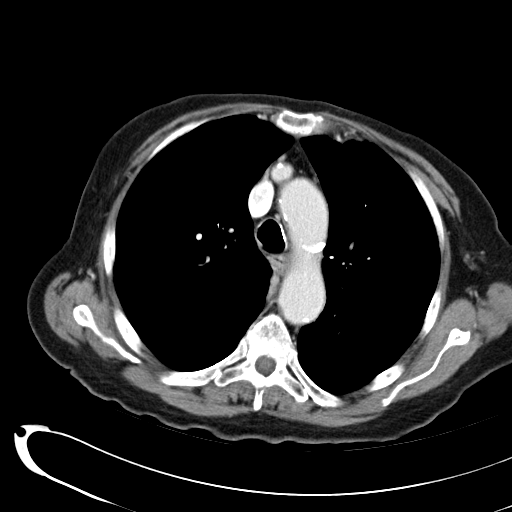
[im 101/119  lung]
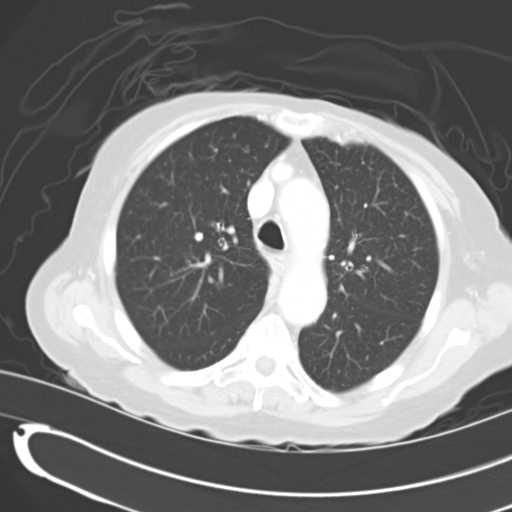
[im 113/119  lung]
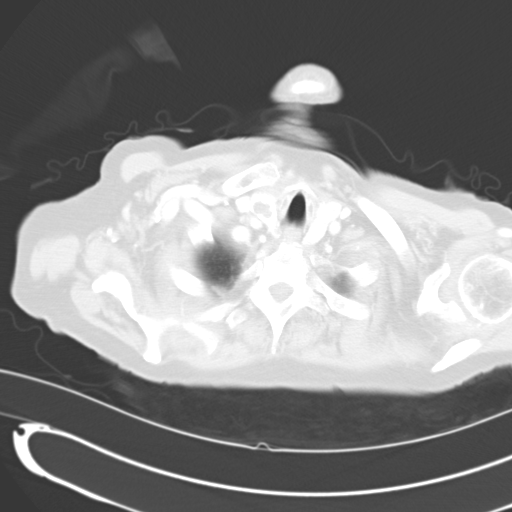

[Series 4: mpr cor post contrast (id) · coronal · 0.67mm/px · 3 of 79 slices shown]
[im 16/79  lung]
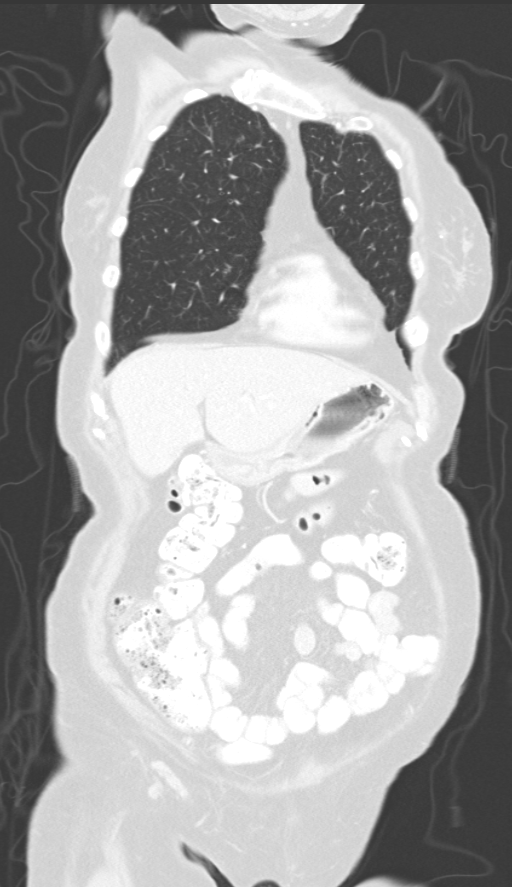
[im 32/79  lung]
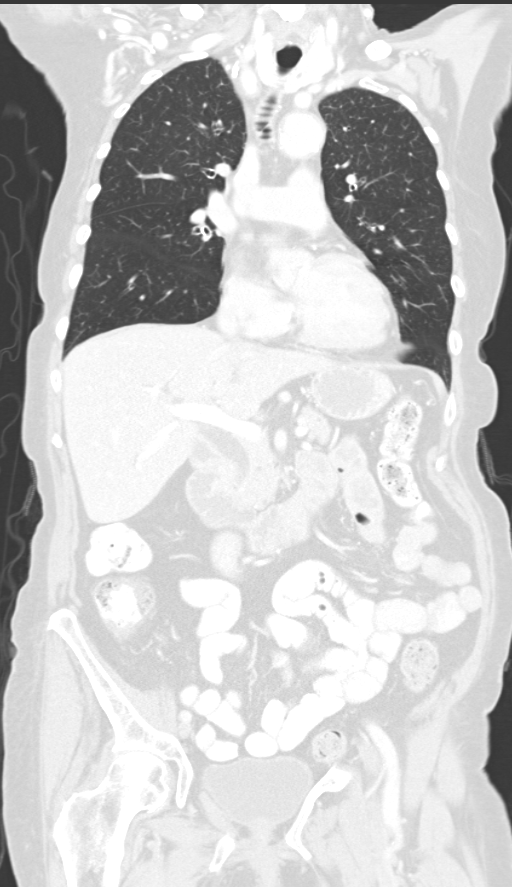
[im 47/79  lung]
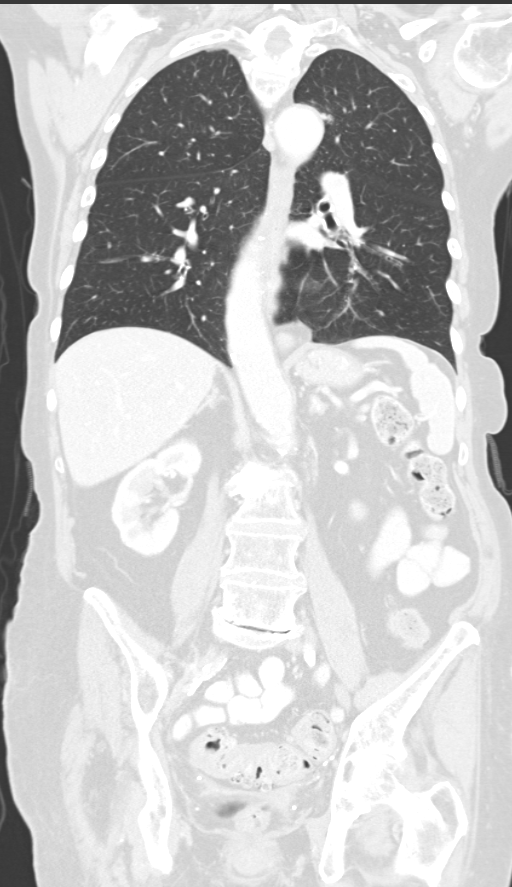

[13 of 36 positions shown; findings below may reference images not displayed]

FINDINGS: Visualized thyroid is notable for bilateral thyroid
nodules with calcifications.

The heart is normal in size.  No pericardial effusion.
Atherosclerotic calcifications of the aortic arch.

Small mediastinal lymph nodes which do not meet pathologic CT size
criteria.  No suspicious hilar or axillary lymphadenopathy.

No suspicious pulmonary nodules.  No pleural effusion or
pneumothorax.

Degenerative changes of the visualized thoracolumbar spine.  No
focal osseous lesions.
IMPRESSION: No evidence of metastatic disease in the chest.

Bilateral thyroid nodules with calcifications.

CT ABDOMEN AND PELVIS
FINDINGS: Tiny hiatal hernia.

Focal fat along the falciform ligament.  Calcified granuloma
posteriorly.  No suspicious hepatic lesions.

Spleen, pancreas, and adrenal glands are within normal limits.

Status post cholecystectomy.  Mild central intrahepatic ductal
dilatation.  Common bile duct measures 10 mm, stable.

Bilateral renal cysts.  No hydronephrosis.

No evidence of bowel obstruction. Colonic diverticulosis, without
associated inflammatory changes.

Atherosclerotic calcifications of the abdominal aorta and branch
vessels.

No abdominopelvic ascites.

No suspicious abdominopelvic lymphadenopathy.

Status post hysterectomy.  No adnexal masses.

Bladder is unremarkable.

Degenerative changes of the visualized thoracolumbar spine, most
severe at L2-3.  No focal osseous lesions.
IMPRESSION: No evidence of metastatic disease in the abdomen/pelvis.

Status post cholecystectomy.  Stable mild intrahepatic and
extrahepatic ductal dilatation.

Additional stable ancillary findings as above.
